# Patient Record
Sex: Female | Born: 1945 | Race: White | Hispanic: No | Marital: Married | Smoking: Never smoker
Health system: Southern US, Community
[De-identification: ages and names within clinical notes are randomized; demographics above are authoritative.]

## PROBLEM LIST (undated history)

## (undated) DIAGNOSIS — J189 Pneumonia, unspecified organism: Secondary | ICD-10-CM

## (undated) DIAGNOSIS — I10 Essential (primary) hypertension: Secondary | ICD-10-CM

## (undated) DIAGNOSIS — Z923 Personal history of irradiation: Secondary | ICD-10-CM

## (undated) DIAGNOSIS — C801 Malignant (primary) neoplasm, unspecified: Secondary | ICD-10-CM

## (undated) DIAGNOSIS — G629 Polyneuropathy, unspecified: Secondary | ICD-10-CM

## (undated) DIAGNOSIS — M359 Systemic involvement of connective tissue, unspecified: Secondary | ICD-10-CM

## (undated) DIAGNOSIS — R011 Cardiac murmur, unspecified: Secondary | ICD-10-CM

## (undated) DIAGNOSIS — R0600 Dyspnea, unspecified: Secondary | ICD-10-CM

## (undated) DIAGNOSIS — R519 Headache, unspecified: Secondary | ICD-10-CM

## (undated) HISTORY — PX: CHOLECYSTECTOMY: SHX55

## (undated) HISTORY — PX: CATARACT EXTRACTION: SUR2

## (undated) HISTORY — PX: LUMBAR FUSION: SHX111

## (undated) HISTORY — PX: TONSILLECTOMY: SUR1361

## (undated) HISTORY — PX: CARPAL TUNNEL RELEASE: SHX101

## (undated) HISTORY — PX: COLONOSCOPY: SHX174

## (undated) HISTORY — PX: CATARACT EXTRACTION W/ INTRAOCULAR LENS IMPLANT: SHX1309

## (undated) HISTORY — DX: Polyneuropathy, unspecified: G62.9

## (undated) HISTORY — DX: Essential (primary) hypertension: I10

## (undated) HISTORY — PX: BREAST LUMPECTOMY: SHX2

---
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
## 1997-09-10 HISTORY — PX: BREAST LUMPECTOMY: SHX2

## 1997-12-03 ENCOUNTER — Ambulatory Visit (HOSPITAL_COMMUNITY): Admission: RE | Admit: 1997-12-03 | Discharge: 1997-12-03 | Payer: Self-pay | Admitting: Obstetrics and Gynecology

## 1997-12-08 ENCOUNTER — Ambulatory Visit (HOSPITAL_COMMUNITY): Admission: RE | Admit: 1997-12-08 | Discharge: 1997-12-08 | Payer: Self-pay | Admitting: Obstetrics and Gynecology

## 1998-01-05 ENCOUNTER — Ambulatory Visit (HOSPITAL_COMMUNITY): Admission: RE | Admit: 1998-01-05 | Discharge: 1998-01-05 | Payer: Self-pay | Admitting: *Deleted

## 1998-01-26 ENCOUNTER — Ambulatory Visit (HOSPITAL_COMMUNITY): Admission: RE | Admit: 1998-01-26 | Discharge: 1998-01-26 | Payer: Self-pay | Admitting: *Deleted

## 1998-02-10 ENCOUNTER — Encounter: Admission: RE | Admit: 1998-02-10 | Discharge: 1998-05-11 | Payer: Self-pay | Admitting: Radiation Oncology

## 1998-03-01 ENCOUNTER — Ambulatory Visit (HOSPITAL_COMMUNITY): Admission: RE | Admit: 1998-03-01 | Discharge: 1998-03-01 | Payer: Self-pay | Admitting: Radiation Oncology

## 1998-06-17 ENCOUNTER — Other Ambulatory Visit: Admission: RE | Admit: 1998-06-17 | Discharge: 1998-06-17 | Payer: Self-pay | Admitting: Obstetrics and Gynecology

## 1998-08-15 ENCOUNTER — Ambulatory Visit (HOSPITAL_COMMUNITY): Admission: RE | Admit: 1998-08-15 | Discharge: 1998-08-15 | Payer: Self-pay | Admitting: Obstetrics and Gynecology

## 1998-08-15 ENCOUNTER — Encounter: Payer: Self-pay | Admitting: Obstetrics and Gynecology

## 1999-02-21 ENCOUNTER — Ambulatory Visit (HOSPITAL_COMMUNITY): Admission: RE | Admit: 1999-02-21 | Discharge: 1999-02-21 | Payer: Self-pay | Admitting: Obstetrics and Gynecology

## 1999-02-21 ENCOUNTER — Encounter: Payer: Self-pay | Admitting: Obstetrics and Gynecology

## 1999-06-20 ENCOUNTER — Other Ambulatory Visit: Admission: RE | Admit: 1999-06-20 | Discharge: 1999-06-20 | Payer: Self-pay | Admitting: Obstetrics and Gynecology

## 1999-08-22 ENCOUNTER — Encounter: Payer: Self-pay | Admitting: General Surgery

## 1999-08-22 ENCOUNTER — Ambulatory Visit (HOSPITAL_COMMUNITY): Admission: RE | Admit: 1999-08-22 | Discharge: 1999-08-22 | Payer: Self-pay | Admitting: General Surgery

## 2000-02-16 ENCOUNTER — Encounter: Payer: Self-pay | Admitting: Obstetrics and Gynecology

## 2000-02-16 ENCOUNTER — Encounter: Admission: RE | Admit: 2000-02-16 | Discharge: 2000-02-16 | Payer: Self-pay | Admitting: Obstetrics and Gynecology

## 2000-06-28 ENCOUNTER — Other Ambulatory Visit: Admission: RE | Admit: 2000-06-28 | Discharge: 2000-06-28 | Payer: Self-pay | Admitting: Obstetrics and Gynecology

## 2000-08-23 ENCOUNTER — Encounter: Admission: RE | Admit: 2000-08-23 | Discharge: 2000-08-23 | Payer: Self-pay | Admitting: Obstetrics and Gynecology

## 2000-08-23 ENCOUNTER — Encounter: Payer: Self-pay | Admitting: Obstetrics and Gynecology

## 2001-07-11 ENCOUNTER — Other Ambulatory Visit: Admission: RE | Admit: 2001-07-11 | Discharge: 2001-07-11 | Payer: Self-pay | Admitting: Obstetrics and Gynecology

## 2001-09-09 ENCOUNTER — Encounter: Payer: Self-pay | Admitting: Obstetrics and Gynecology

## 2001-09-09 ENCOUNTER — Encounter: Admission: RE | Admit: 2001-09-09 | Discharge: 2001-09-09 | Payer: Self-pay | Admitting: Obstetrics and Gynecology

## 2002-08-21 ENCOUNTER — Other Ambulatory Visit: Admission: RE | Admit: 2002-08-21 | Discharge: 2002-08-21 | Payer: Self-pay | Admitting: Obstetrics and Gynecology

## 2002-09-25 ENCOUNTER — Encounter: Payer: Self-pay | Admitting: Obstetrics and Gynecology

## 2002-09-25 ENCOUNTER — Encounter: Admission: RE | Admit: 2002-09-25 | Discharge: 2002-09-25 | Payer: Self-pay | Admitting: Obstetrics and Gynecology

## 2003-07-12 ENCOUNTER — Ambulatory Visit (HOSPITAL_COMMUNITY): Admission: RE | Admit: 2003-07-12 | Discharge: 2003-07-12 | Payer: Self-pay | Admitting: *Deleted

## 2003-09-17 ENCOUNTER — Other Ambulatory Visit: Admission: RE | Admit: 2003-09-17 | Discharge: 2003-09-17 | Payer: Self-pay | Admitting: Obstetrics and Gynecology

## 2003-12-31 ENCOUNTER — Encounter: Admission: RE | Admit: 2003-12-31 | Discharge: 2003-12-31 | Payer: Self-pay | Admitting: General Surgery

## 2004-09-28 ENCOUNTER — Encounter: Admission: RE | Admit: 2004-09-28 | Discharge: 2004-09-28 | Payer: Self-pay | Admitting: Neurosurgery

## 2004-09-29 ENCOUNTER — Other Ambulatory Visit: Admission: RE | Admit: 2004-09-29 | Discharge: 2004-09-29 | Payer: Self-pay | Admitting: Obstetrics and Gynecology

## 2005-01-17 ENCOUNTER — Encounter: Admission: RE | Admit: 2005-01-17 | Discharge: 2005-01-17 | Payer: Self-pay | Admitting: General Surgery

## 2005-09-10 HISTORY — PX: CARPAL TUNNEL RELEASE: SHX101

## 2005-11-07 ENCOUNTER — Other Ambulatory Visit: Admission: RE | Admit: 2005-11-07 | Discharge: 2005-11-07 | Payer: Self-pay | Admitting: Obstetrics and Gynecology

## 2006-03-29 ENCOUNTER — Encounter: Admission: RE | Admit: 2006-03-29 | Discharge: 2006-03-29 | Payer: Self-pay | Admitting: General Surgery

## 2006-04-12 ENCOUNTER — Encounter: Admission: RE | Admit: 2006-04-12 | Discharge: 2006-04-12 | Payer: Self-pay | Admitting: General Surgery

## 2006-09-10 HISTORY — PX: CHOLECYSTECTOMY: SHX55

## 2007-03-07 ENCOUNTER — Encounter: Admission: RE | Admit: 2007-03-07 | Discharge: 2007-03-07 | Payer: Self-pay | Admitting: General Surgery

## 2008-03-16 ENCOUNTER — Encounter: Admission: RE | Admit: 2008-03-16 | Discharge: 2008-03-16 | Payer: Self-pay | Admitting: Obstetrics and Gynecology

## 2009-04-29 ENCOUNTER — Encounter: Admission: RE | Admit: 2009-04-29 | Discharge: 2009-04-29 | Payer: Self-pay | Admitting: Obstetrics and Gynecology

## 2010-08-11 ENCOUNTER — Encounter: Admission: RE | Admit: 2010-08-11 | Discharge: 2010-08-11 | Payer: Self-pay | Admitting: Obstetrics and Gynecology

## 2010-09-30 ENCOUNTER — Encounter: Payer: Self-pay | Admitting: Radiation Oncology

## 2010-10-01 ENCOUNTER — Encounter: Payer: Self-pay | Admitting: General Surgery

## 2010-10-02 ENCOUNTER — Encounter: Payer: Self-pay | Admitting: Obstetrics and Gynecology

## 2011-07-10 ENCOUNTER — Other Ambulatory Visit: Payer: Self-pay | Admitting: Obstetrics and Gynecology

## 2011-07-10 DIAGNOSIS — Z1231 Encounter for screening mammogram for malignant neoplasm of breast: Secondary | ICD-10-CM

## 2011-08-17 ENCOUNTER — Ambulatory Visit
Admission: RE | Admit: 2011-08-17 | Discharge: 2011-08-17 | Disposition: A | Discharge status: Home / Self Care | Payer: 59 | Source: Ambulatory Visit | Attending: Obstetrics and Gynecology | Admitting: Obstetrics and Gynecology

## 2011-08-17 DIAGNOSIS — Z1231 Encounter for screening mammogram for malignant neoplasm of breast: Secondary | ICD-10-CM

## 2012-07-11 ENCOUNTER — Other Ambulatory Visit: Payer: Self-pay | Admitting: Obstetrics and Gynecology

## 2012-07-11 DIAGNOSIS — Z1231 Encounter for screening mammogram for malignant neoplasm of breast: Secondary | ICD-10-CM

## 2012-08-22 ENCOUNTER — Ambulatory Visit
Admission: RE | Admit: 2012-08-22 | Discharge: 2012-08-22 | Disposition: A | Discharge status: Home / Self Care | Payer: BC Managed Care – PPO | Source: Ambulatory Visit | Attending: Obstetrics and Gynecology | Admitting: Obstetrics and Gynecology

## 2012-08-22 DIAGNOSIS — Z1231 Encounter for screening mammogram for malignant neoplasm of breast: Secondary | ICD-10-CM

## 2013-07-20 ENCOUNTER — Other Ambulatory Visit: Payer: Self-pay

## 2013-07-20 DIAGNOSIS — Z1231 Encounter for screening mammogram for malignant neoplasm of breast: Secondary | ICD-10-CM

## 2013-08-28 ENCOUNTER — Ambulatory Visit
Admission: RE | Admit: 2013-08-28 | Discharge: 2013-08-28 | Disposition: A | Discharge status: Home / Self Care | Payer: BC Managed Care – PPO | Source: Ambulatory Visit

## 2013-08-28 ENCOUNTER — Ambulatory Visit: Payer: BC Managed Care – PPO

## 2013-08-28 DIAGNOSIS — Z1231 Encounter for screening mammogram for malignant neoplasm of breast: Secondary | ICD-10-CM

## 2013-11-30 ENCOUNTER — Other Ambulatory Visit: Payer: Self-pay | Admitting: Chiropractic Medicine

## 2013-11-30 DIAGNOSIS — M5126 Other intervertebral disc displacement, lumbar region: Secondary | ICD-10-CM

## 2013-12-04 ENCOUNTER — Ambulatory Visit
Admission: RE | Admit: 2013-12-04 | Discharge: 2013-12-04 | Disposition: A | Discharge status: Home / Self Care | Payer: BC Managed Care – PPO | Source: Ambulatory Visit | Attending: Chiropractic Medicine | Admitting: Chiropractic Medicine

## 2013-12-04 DIAGNOSIS — M5126 Other intervertebral disc displacement, lumbar region: Secondary | ICD-10-CM

## 2014-03-26 DIAGNOSIS — M47817 Spondylosis without myelopathy or radiculopathy, lumbosacral region: Secondary | ICD-10-CM | POA: Insufficient documentation

## 2014-09-06 ENCOUNTER — Other Ambulatory Visit: Payer: Self-pay

## 2014-09-06 DIAGNOSIS — Z1231 Encounter for screening mammogram for malignant neoplasm of breast: Secondary | ICD-10-CM

## 2014-09-09 ENCOUNTER — Ambulatory Visit
Admission: RE | Admit: 2014-09-09 | Discharge: 2014-09-09 | Disposition: A | Discharge status: Home / Self Care | Payer: Medicare HMO | Source: Ambulatory Visit

## 2014-09-09 DIAGNOSIS — Z1231 Encounter for screening mammogram for malignant neoplasm of breast: Secondary | ICD-10-CM

## 2015-09-13 DIAGNOSIS — M9902 Segmental and somatic dysfunction of thoracic region: Secondary | ICD-10-CM | POA: Diagnosis not present

## 2015-09-13 DIAGNOSIS — M9903 Segmental and somatic dysfunction of lumbar region: Secondary | ICD-10-CM | POA: Diagnosis not present

## 2015-09-13 DIAGNOSIS — M542 Cervicalgia: Secondary | ICD-10-CM | POA: Diagnosis not present

## 2015-09-13 DIAGNOSIS — M545 Low back pain: Secondary | ICD-10-CM | POA: Diagnosis not present

## 2015-09-13 DIAGNOSIS — M546 Pain in thoracic spine: Secondary | ICD-10-CM | POA: Diagnosis not present

## 2015-09-13 DIAGNOSIS — M9901 Segmental and somatic dysfunction of cervical region: Secondary | ICD-10-CM | POA: Diagnosis not present

## 2015-09-13 DIAGNOSIS — M47812 Spondylosis without myelopathy or radiculopathy, cervical region: Secondary | ICD-10-CM | POA: Diagnosis not present

## 2015-09-15 DIAGNOSIS — M542 Cervicalgia: Secondary | ICD-10-CM | POA: Diagnosis not present

## 2015-09-15 DIAGNOSIS — M546 Pain in thoracic spine: Secondary | ICD-10-CM | POA: Diagnosis not present

## 2015-09-15 DIAGNOSIS — M545 Low back pain: Secondary | ICD-10-CM | POA: Diagnosis not present

## 2015-09-15 DIAGNOSIS — M47812 Spondylosis without myelopathy or radiculopathy, cervical region: Secondary | ICD-10-CM | POA: Diagnosis not present

## 2015-09-15 DIAGNOSIS — M9902 Segmental and somatic dysfunction of thoracic region: Secondary | ICD-10-CM | POA: Diagnosis not present

## 2015-09-15 DIAGNOSIS — M9903 Segmental and somatic dysfunction of lumbar region: Secondary | ICD-10-CM | POA: Diagnosis not present

## 2015-09-15 DIAGNOSIS — M9901 Segmental and somatic dysfunction of cervical region: Secondary | ICD-10-CM | POA: Diagnosis not present

## 2015-09-21 DIAGNOSIS — M9901 Segmental and somatic dysfunction of cervical region: Secondary | ICD-10-CM | POA: Diagnosis not present

## 2015-09-21 DIAGNOSIS — M47812 Spondylosis without myelopathy or radiculopathy, cervical region: Secondary | ICD-10-CM | POA: Diagnosis not present

## 2015-09-21 DIAGNOSIS — M546 Pain in thoracic spine: Secondary | ICD-10-CM | POA: Diagnosis not present

## 2015-09-21 DIAGNOSIS — M542 Cervicalgia: Secondary | ICD-10-CM | POA: Diagnosis not present

## 2015-09-21 DIAGNOSIS — M9903 Segmental and somatic dysfunction of lumbar region: Secondary | ICD-10-CM | POA: Diagnosis not present

## 2015-09-21 DIAGNOSIS — M9902 Segmental and somatic dysfunction of thoracic region: Secondary | ICD-10-CM | POA: Diagnosis not present

## 2015-09-21 DIAGNOSIS — M545 Low back pain: Secondary | ICD-10-CM | POA: Diagnosis not present

## 2015-09-29 ENCOUNTER — Other Ambulatory Visit: Payer: Self-pay

## 2015-09-29 DIAGNOSIS — Z1231 Encounter for screening mammogram for malignant neoplasm of breast: Secondary | ICD-10-CM

## 2015-10-12 ENCOUNTER — Ambulatory Visit
Admission: RE | Admit: 2015-10-12 | Discharge: 2015-10-12 | Disposition: A | Discharge status: Home / Self Care | Payer: PPO | Source: Ambulatory Visit

## 2015-10-12 DIAGNOSIS — Z1231 Encounter for screening mammogram for malignant neoplasm of breast: Secondary | ICD-10-CM | POA: Diagnosis not present

## 2015-10-13 DIAGNOSIS — M9903 Segmental and somatic dysfunction of lumbar region: Secondary | ICD-10-CM | POA: Diagnosis not present

## 2015-10-13 DIAGNOSIS — M62830 Muscle spasm of back: Secondary | ICD-10-CM | POA: Diagnosis not present

## 2015-10-13 DIAGNOSIS — M9901 Segmental and somatic dysfunction of cervical region: Secondary | ICD-10-CM | POA: Diagnosis not present

## 2015-10-17 DIAGNOSIS — M62830 Muscle spasm of back: Secondary | ICD-10-CM | POA: Diagnosis not present

## 2015-10-17 DIAGNOSIS — M9903 Segmental and somatic dysfunction of lumbar region: Secondary | ICD-10-CM | POA: Diagnosis not present

## 2015-10-17 DIAGNOSIS — M9901 Segmental and somatic dysfunction of cervical region: Secondary | ICD-10-CM | POA: Diagnosis not present

## 2015-10-17 DIAGNOSIS — M9902 Segmental and somatic dysfunction of thoracic region: Secondary | ICD-10-CM | POA: Diagnosis not present

## 2015-10-18 DIAGNOSIS — M542 Cervicalgia: Secondary | ICD-10-CM | POA: Insufficient documentation

## 2015-10-18 DIAGNOSIS — M5030 Other cervical disc degeneration, unspecified cervical region: Secondary | ICD-10-CM | POA: Diagnosis not present

## 2015-10-18 DIAGNOSIS — M47816 Spondylosis without myelopathy or radiculopathy, lumbar region: Secondary | ICD-10-CM | POA: Diagnosis not present

## 2015-10-18 DIAGNOSIS — M47812 Spondylosis without myelopathy or radiculopathy, cervical region: Secondary | ICD-10-CM | POA: Diagnosis not present

## 2015-10-24 DIAGNOSIS — M1812 Unilateral primary osteoarthritis of first carpometacarpal joint, left hand: Secondary | ICD-10-CM | POA: Diagnosis not present

## 2015-11-07 DIAGNOSIS — M9901 Segmental and somatic dysfunction of cervical region: Secondary | ICD-10-CM | POA: Diagnosis not present

## 2015-11-07 DIAGNOSIS — M9906 Segmental and somatic dysfunction of lower extremity: Secondary | ICD-10-CM | POA: Diagnosis not present

## 2015-11-07 DIAGNOSIS — M5032 Other cervical disc degeneration, mid-cervical region, unspecified level: Secondary | ICD-10-CM | POA: Diagnosis not present

## 2015-11-07 DIAGNOSIS — M9902 Segmental and somatic dysfunction of thoracic region: Secondary | ICD-10-CM | POA: Diagnosis not present

## 2015-11-21 DIAGNOSIS — M9906 Segmental and somatic dysfunction of lower extremity: Secondary | ICD-10-CM | POA: Diagnosis not present

## 2015-11-21 DIAGNOSIS — M5032 Other cervical disc degeneration, mid-cervical region, unspecified level: Secondary | ICD-10-CM | POA: Diagnosis not present

## 2015-11-21 DIAGNOSIS — M9902 Segmental and somatic dysfunction of thoracic region: Secondary | ICD-10-CM | POA: Diagnosis not present

## 2015-11-21 DIAGNOSIS — M9901 Segmental and somatic dysfunction of cervical region: Secondary | ICD-10-CM | POA: Diagnosis not present

## 2015-12-12 DIAGNOSIS — M5032 Other cervical disc degeneration, mid-cervical region, unspecified level: Secondary | ICD-10-CM | POA: Diagnosis not present

## 2015-12-12 DIAGNOSIS — M9901 Segmental and somatic dysfunction of cervical region: Secondary | ICD-10-CM | POA: Diagnosis not present

## 2015-12-12 DIAGNOSIS — H35363 Drusen (degenerative) of macula, bilateral: Secondary | ICD-10-CM | POA: Diagnosis not present

## 2015-12-12 DIAGNOSIS — M9906 Segmental and somatic dysfunction of lower extremity: Secondary | ICD-10-CM | POA: Diagnosis not present

## 2015-12-12 DIAGNOSIS — H401131 Primary open-angle glaucoma, bilateral, mild stage: Secondary | ICD-10-CM | POA: Diagnosis not present

## 2015-12-12 DIAGNOSIS — M9902 Segmental and somatic dysfunction of thoracic region: Secondary | ICD-10-CM | POA: Diagnosis not present

## 2016-01-20 DIAGNOSIS — I341 Nonrheumatic mitral (valve) prolapse: Secondary | ICD-10-CM

## 2016-01-20 DIAGNOSIS — G5760 Lesion of plantar nerve, unspecified lower limb: Secondary | ICD-10-CM | POA: Insufficient documentation

## 2016-01-20 DIAGNOSIS — M8580 Other specified disorders of bone density and structure, unspecified site: Secondary | ICD-10-CM

## 2016-01-20 DIAGNOSIS — R5383 Other fatigue: Secondary | ICD-10-CM

## 2016-01-20 DIAGNOSIS — R5381 Other malaise: Secondary | ICD-10-CM | POA: Insufficient documentation

## 2016-01-20 DIAGNOSIS — E782 Mixed hyperlipidemia: Secondary | ICD-10-CM

## 2016-01-20 HISTORY — DX: Other specified disorders of bone density and structure, unspecified site: M85.80

## 2016-01-20 HISTORY — DX: Mixed hyperlipidemia: E78.2

## 2016-01-20 HISTORY — DX: Other fatigue: R53.83

## 2016-01-20 HISTORY — DX: Nonrheumatic mitral (valve) prolapse: I34.1

## 2016-01-20 HISTORY — DX: Other malaise: R53.81

## 2016-01-23 DIAGNOSIS — I341 Nonrheumatic mitral (valve) prolapse: Secondary | ICD-10-CM | POA: Diagnosis not present

## 2016-01-23 DIAGNOSIS — R5381 Other malaise: Secondary | ICD-10-CM | POA: Diagnosis not present

## 2016-01-23 DIAGNOSIS — M8949 Other hypertrophic osteoarthropathy, multiple sites: Secondary | ICD-10-CM

## 2016-01-23 DIAGNOSIS — H402234 Chronic angle-closure glaucoma, bilateral, indeterminate stage: Secondary | ICD-10-CM | POA: Diagnosis not present

## 2016-01-23 DIAGNOSIS — E782 Mixed hyperlipidemia: Secondary | ICD-10-CM | POA: Diagnosis not present

## 2016-01-23 DIAGNOSIS — M8580 Other specified disorders of bone density and structure, unspecified site: Secondary | ICD-10-CM | POA: Diagnosis not present

## 2016-01-23 DIAGNOSIS — M159 Polyosteoarthritis, unspecified: Secondary | ICD-10-CM

## 2016-01-23 DIAGNOSIS — M150 Primary generalized (osteo)arthritis: Secondary | ICD-10-CM

## 2016-01-23 DIAGNOSIS — R5383 Other fatigue: Secondary | ICD-10-CM | POA: Diagnosis not present

## 2016-01-23 HISTORY — DX: Polyosteoarthritis, unspecified: M15.9

## 2016-01-23 HISTORY — DX: Other hypertrophic osteoarthropathy, multiple sites: M89.49

## 2016-01-23 HISTORY — DX: Primary generalized (osteo)arthritis: M15.0

## 2016-01-30 DIAGNOSIS — M25561 Pain in right knee: Secondary | ICD-10-CM | POA: Diagnosis not present

## 2016-02-09 DIAGNOSIS — M25561 Pain in right knee: Secondary | ICD-10-CM | POA: Diagnosis not present

## 2016-05-08 DIAGNOSIS — I341 Nonrheumatic mitral (valve) prolapse: Secondary | ICD-10-CM | POA: Diagnosis not present

## 2016-05-08 DIAGNOSIS — E785 Hyperlipidemia, unspecified: Secondary | ICD-10-CM | POA: Insufficient documentation

## 2016-05-08 DIAGNOSIS — R0789 Other chest pain: Secondary | ICD-10-CM | POA: Diagnosis not present

## 2016-05-08 DIAGNOSIS — R002 Palpitations: Secondary | ICD-10-CM | POA: Diagnosis not present

## 2016-05-08 HISTORY — DX: Hyperlipidemia, unspecified: E78.5

## 2016-05-29 DIAGNOSIS — R002 Palpitations: Secondary | ICD-10-CM | POA: Diagnosis not present

## 2016-05-29 DIAGNOSIS — I341 Nonrheumatic mitral (valve) prolapse: Secondary | ICD-10-CM | POA: Diagnosis not present

## 2016-05-29 DIAGNOSIS — E785 Hyperlipidemia, unspecified: Secondary | ICD-10-CM | POA: Diagnosis not present

## 2016-05-29 DIAGNOSIS — R0789 Other chest pain: Secondary | ICD-10-CM | POA: Diagnosis not present

## 2016-06-06 DIAGNOSIS — Z6825 Body mass index (BMI) 25.0-25.9, adult: Secondary | ICD-10-CM | POA: Diagnosis not present

## 2016-06-06 DIAGNOSIS — Z01419 Encounter for gynecological examination (general) (routine) without abnormal findings: Secondary | ICD-10-CM | POA: Diagnosis not present

## 2016-06-12 DIAGNOSIS — R002 Palpitations: Secondary | ICD-10-CM | POA: Diagnosis not present

## 2016-06-12 DIAGNOSIS — I341 Nonrheumatic mitral (valve) prolapse: Secondary | ICD-10-CM | POA: Diagnosis not present

## 2016-06-12 DIAGNOSIS — E785 Hyperlipidemia, unspecified: Secondary | ICD-10-CM | POA: Diagnosis not present

## 2016-06-12 DIAGNOSIS — R0789 Other chest pain: Secondary | ICD-10-CM | POA: Diagnosis not present

## 2016-06-25 DIAGNOSIS — M25561 Pain in right knee: Secondary | ICD-10-CM | POA: Diagnosis not present

## 2016-06-26 DIAGNOSIS — H524 Presbyopia: Secondary | ICD-10-CM | POA: Diagnosis not present

## 2016-06-26 DIAGNOSIS — H401131 Primary open-angle glaucoma, bilateral, mild stage: Secondary | ICD-10-CM | POA: Diagnosis not present

## 2016-06-26 DIAGNOSIS — H35363 Drusen (degenerative) of macula, bilateral: Secondary | ICD-10-CM | POA: Diagnosis not present

## 2016-06-26 DIAGNOSIS — H2513 Age-related nuclear cataract, bilateral: Secondary | ICD-10-CM | POA: Diagnosis not present

## 2016-07-26 DIAGNOSIS — M1812 Unilateral primary osteoarthritis of first carpometacarpal joint, left hand: Secondary | ICD-10-CM | POA: Diagnosis not present

## 2016-07-26 DIAGNOSIS — M79642 Pain in left hand: Secondary | ICD-10-CM | POA: Diagnosis not present

## 2016-09-14 DIAGNOSIS — J069 Acute upper respiratory infection, unspecified: Secondary | ICD-10-CM | POA: Diagnosis not present

## 2016-09-25 DIAGNOSIS — R5381 Other malaise: Secondary | ICD-10-CM | POA: Diagnosis not present

## 2016-09-25 DIAGNOSIS — R5383 Other fatigue: Secondary | ICD-10-CM | POA: Diagnosis not present

## 2016-09-25 DIAGNOSIS — I517 Cardiomegaly: Secondary | ICD-10-CM | POA: Diagnosis not present

## 2016-09-25 DIAGNOSIS — J440 Chronic obstructive pulmonary disease with acute lower respiratory infection: Secondary | ICD-10-CM | POA: Diagnosis not present

## 2016-09-25 DIAGNOSIS — R05 Cough: Secondary | ICD-10-CM | POA: Diagnosis not present

## 2016-09-25 DIAGNOSIS — J40 Bronchitis, not specified as acute or chronic: Secondary | ICD-10-CM | POA: Diagnosis not present

## 2016-10-22 ENCOUNTER — Other Ambulatory Visit: Payer: Self-pay | Admitting: Obstetrics and Gynecology

## 2016-10-22 DIAGNOSIS — Z1231 Encounter for screening mammogram for malignant neoplasm of breast: Secondary | ICD-10-CM

## 2016-10-23 DIAGNOSIS — Z1211 Encounter for screening for malignant neoplasm of colon: Secondary | ICD-10-CM | POA: Diagnosis not present

## 2016-10-31 DIAGNOSIS — K5730 Diverticulosis of large intestine without perforation or abscess without bleeding: Secondary | ICD-10-CM | POA: Diagnosis not present

## 2016-10-31 DIAGNOSIS — D120 Benign neoplasm of cecum: Secondary | ICD-10-CM | POA: Diagnosis not present

## 2016-10-31 DIAGNOSIS — Z1211 Encounter for screening for malignant neoplasm of colon: Secondary | ICD-10-CM | POA: Diagnosis not present

## 2016-10-31 DIAGNOSIS — K635 Polyp of colon: Secondary | ICD-10-CM | POA: Diagnosis not present

## 2016-11-06 ENCOUNTER — Ambulatory Visit
Admission: RE | Admit: 2016-11-06 | Discharge: 2016-11-06 | Disposition: A | Discharge status: Home / Self Care | Payer: PPO | Source: Ambulatory Visit | Attending: Obstetrics and Gynecology | Admitting: Obstetrics and Gynecology

## 2016-11-06 DIAGNOSIS — Z1231 Encounter for screening mammogram for malignant neoplasm of breast: Secondary | ICD-10-CM

## 2016-12-24 DIAGNOSIS — H401131 Primary open-angle glaucoma, bilateral, mild stage: Secondary | ICD-10-CM | POA: Diagnosis not present

## 2017-04-17 DIAGNOSIS — N958 Other specified menopausal and perimenopausal disorders: Secondary | ICD-10-CM | POA: Diagnosis not present

## 2017-04-17 DIAGNOSIS — M8588 Other specified disorders of bone density and structure, other site: Secondary | ICD-10-CM | POA: Diagnosis not present

## 2017-06-18 DIAGNOSIS — Z124 Encounter for screening for malignant neoplasm of cervix: Secondary | ICD-10-CM | POA: Diagnosis not present

## 2017-06-18 DIAGNOSIS — Z6826 Body mass index (BMI) 26.0-26.9, adult: Secondary | ICD-10-CM | POA: Diagnosis not present

## 2017-07-02 DIAGNOSIS — H401131 Primary open-angle glaucoma, bilateral, mild stage: Secondary | ICD-10-CM | POA: Diagnosis not present

## 2017-07-02 DIAGNOSIS — H2513 Age-related nuclear cataract, bilateral: Secondary | ICD-10-CM | POA: Diagnosis not present

## 2017-07-02 DIAGNOSIS — H52223 Regular astigmatism, bilateral: Secondary | ICD-10-CM | POA: Diagnosis not present

## 2017-07-02 DIAGNOSIS — H35363 Drusen (degenerative) of macula, bilateral: Secondary | ICD-10-CM | POA: Diagnosis not present

## 2017-10-17 ENCOUNTER — Other Ambulatory Visit: Payer: Self-pay | Admitting: Obstetrics and Gynecology

## 2017-10-17 DIAGNOSIS — Z1231 Encounter for screening mammogram for malignant neoplasm of breast: Secondary | ICD-10-CM

## 2017-11-12 ENCOUNTER — Ambulatory Visit: Payer: Self-pay

## 2017-11-13 ENCOUNTER — Ambulatory Visit
Admission: RE | Admit: 2017-11-13 | Discharge: 2017-11-13 | Disposition: A | Discharge status: Home / Self Care | Payer: PPO | Source: Ambulatory Visit | Attending: Obstetrics and Gynecology | Admitting: Obstetrics and Gynecology

## 2017-11-13 DIAGNOSIS — Z1231 Encounter for screening mammogram for malignant neoplasm of breast: Secondary | ICD-10-CM

## 2017-11-13 HISTORY — DX: Systemic involvement of connective tissue, unspecified: M35.9

## 2017-11-13 HISTORY — DX: Personal history of irradiation: Z92.3

## 2017-11-13 HISTORY — DX: Malignant (primary) neoplasm, unspecified: C80.1

## 2017-12-31 DIAGNOSIS — H401131 Primary open-angle glaucoma, bilateral, mild stage: Secondary | ICD-10-CM | POA: Diagnosis not present

## 2018-05-06 DIAGNOSIS — M5136 Other intervertebral disc degeneration, lumbar region: Secondary | ICD-10-CM | POA: Diagnosis not present

## 2018-05-06 DIAGNOSIS — M48062 Spinal stenosis, lumbar region with neurogenic claudication: Secondary | ICD-10-CM | POA: Diagnosis not present

## 2018-05-06 DIAGNOSIS — M47816 Spondylosis without myelopathy or radiculopathy, lumbar region: Secondary | ICD-10-CM | POA: Diagnosis not present

## 2018-05-06 DIAGNOSIS — M4316 Spondylolisthesis, lumbar region: Secondary | ICD-10-CM | POA: Diagnosis not present

## 2018-06-24 DIAGNOSIS — Z01419 Encounter for gynecological examination (general) (routine) without abnormal findings: Secondary | ICD-10-CM | POA: Diagnosis not present

## 2018-06-24 DIAGNOSIS — Z6827 Body mass index (BMI) 27.0-27.9, adult: Secondary | ICD-10-CM | POA: Diagnosis not present

## 2018-07-02 DIAGNOSIS — H401131 Primary open-angle glaucoma, bilateral, mild stage: Secondary | ICD-10-CM | POA: Diagnosis not present

## 2018-07-02 DIAGNOSIS — H2513 Age-related nuclear cataract, bilateral: Secondary | ICD-10-CM | POA: Diagnosis not present

## 2018-07-02 DIAGNOSIS — H35363 Drusen (degenerative) of macula, bilateral: Secondary | ICD-10-CM | POA: Diagnosis not present

## 2018-07-02 DIAGNOSIS — H52223 Regular astigmatism, bilateral: Secondary | ICD-10-CM | POA: Diagnosis not present

## 2018-08-14 DIAGNOSIS — L820 Inflamed seborrheic keratosis: Secondary | ICD-10-CM | POA: Diagnosis not present

## 2018-08-14 DIAGNOSIS — L821 Other seborrheic keratosis: Secondary | ICD-10-CM | POA: Diagnosis not present

## 2018-08-14 DIAGNOSIS — D1801 Hemangioma of skin and subcutaneous tissue: Secondary | ICD-10-CM | POA: Diagnosis not present

## 2018-09-18 DIAGNOSIS — H25013 Cortical age-related cataract, bilateral: Secondary | ICD-10-CM | POA: Diagnosis not present

## 2018-09-18 DIAGNOSIS — H25043 Posterior subcapsular polar age-related cataract, bilateral: Secondary | ICD-10-CM | POA: Diagnosis not present

## 2018-09-18 DIAGNOSIS — H2513 Age-related nuclear cataract, bilateral: Secondary | ICD-10-CM | POA: Diagnosis not present

## 2018-09-18 DIAGNOSIS — H401131 Primary open-angle glaucoma, bilateral, mild stage: Secondary | ICD-10-CM | POA: Diagnosis not present

## 2018-09-18 DIAGNOSIS — H2512 Age-related nuclear cataract, left eye: Secondary | ICD-10-CM | POA: Diagnosis not present

## 2018-09-18 DIAGNOSIS — H18413 Arcus senilis, bilateral: Secondary | ICD-10-CM | POA: Diagnosis not present

## 2018-10-06 DIAGNOSIS — R5381 Other malaise: Secondary | ICD-10-CM | POA: Diagnosis not present

## 2018-10-06 DIAGNOSIS — H402234 Chronic angle-closure glaucoma, bilateral, indeterminate stage: Secondary | ICD-10-CM | POA: Diagnosis not present

## 2018-10-06 DIAGNOSIS — R9389 Abnormal findings on diagnostic imaging of other specified body structures: Secondary | ICD-10-CM

## 2018-10-06 DIAGNOSIS — M150 Primary generalized (osteo)arthritis: Secondary | ICD-10-CM | POA: Diagnosis not present

## 2018-10-06 DIAGNOSIS — R030 Elevated blood-pressure reading, without diagnosis of hypertension: Secondary | ICD-10-CM

## 2018-10-06 DIAGNOSIS — I341 Nonrheumatic mitral (valve) prolapse: Secondary | ICD-10-CM | POA: Diagnosis not present

## 2018-10-06 DIAGNOSIS — E559 Vitamin D deficiency, unspecified: Secondary | ICD-10-CM | POA: Diagnosis not present

## 2018-10-06 DIAGNOSIS — E782 Mixed hyperlipidemia: Secondary | ICD-10-CM | POA: Diagnosis not present

## 2018-10-06 DIAGNOSIS — R5383 Other fatigue: Secondary | ICD-10-CM | POA: Diagnosis not present

## 2018-10-06 HISTORY — DX: Elevated blood-pressure reading, without diagnosis of hypertension: R03.0

## 2018-10-06 HISTORY — DX: Abnormal findings on diagnostic imaging of other specified body structures: R93.89

## 2018-10-22 DIAGNOSIS — H2512 Age-related nuclear cataract, left eye: Secondary | ICD-10-CM | POA: Diagnosis not present

## 2018-10-23 DIAGNOSIS — H2511 Age-related nuclear cataract, right eye: Secondary | ICD-10-CM | POA: Diagnosis not present

## 2018-10-29 DIAGNOSIS — R030 Elevated blood-pressure reading, without diagnosis of hypertension: Secondary | ICD-10-CM | POA: Diagnosis not present

## 2018-10-29 DIAGNOSIS — R5383 Other fatigue: Secondary | ICD-10-CM | POA: Diagnosis not present

## 2018-10-29 DIAGNOSIS — J40 Bronchitis, not specified as acute or chronic: Secondary | ICD-10-CM | POA: Diagnosis not present

## 2018-10-29 DIAGNOSIS — R5381 Other malaise: Secondary | ICD-10-CM | POA: Diagnosis not present

## 2018-11-18 ENCOUNTER — Other Ambulatory Visit: Payer: Self-pay | Admitting: Obstetrics and Gynecology

## 2018-11-18 DIAGNOSIS — Z1231 Encounter for screening mammogram for malignant neoplasm of breast: Secondary | ICD-10-CM

## 2018-11-27 ENCOUNTER — Ambulatory Visit: Payer: PPO

## 2018-12-30 ENCOUNTER — Ambulatory Visit: Payer: PPO

## 2019-01-21 DIAGNOSIS — H2511 Age-related nuclear cataract, right eye: Secondary | ICD-10-CM | POA: Diagnosis not present

## 2019-01-28 DIAGNOSIS — H2511 Age-related nuclear cataract, right eye: Secondary | ICD-10-CM | POA: Diagnosis not present

## 2019-02-17 ENCOUNTER — Ambulatory Visit
Admission: RE | Admit: 2019-02-17 | Discharge: 2019-02-17 | Disposition: A | Discharge status: Home / Self Care | Payer: PPO | Source: Ambulatory Visit | Attending: Obstetrics and Gynecology | Admitting: Obstetrics and Gynecology

## 2019-02-17 ENCOUNTER — Other Ambulatory Visit: Payer: Self-pay

## 2019-02-17 DIAGNOSIS — Z1231 Encounter for screening mammogram for malignant neoplasm of breast: Secondary | ICD-10-CM

## 2019-02-24 DIAGNOSIS — M9901 Segmental and somatic dysfunction of cervical region: Secondary | ICD-10-CM | POA: Diagnosis not present

## 2019-02-24 DIAGNOSIS — M9903 Segmental and somatic dysfunction of lumbar region: Secondary | ICD-10-CM | POA: Diagnosis not present

## 2019-02-24 DIAGNOSIS — M461 Sacroiliitis, not elsewhere classified: Secondary | ICD-10-CM | POA: Diagnosis not present

## 2019-02-24 DIAGNOSIS — M9905 Segmental and somatic dysfunction of pelvic region: Secondary | ICD-10-CM | POA: Diagnosis not present

## 2019-02-24 DIAGNOSIS — M47896 Other spondylosis, lumbar region: Secondary | ICD-10-CM | POA: Diagnosis not present

## 2019-02-24 DIAGNOSIS — M9902 Segmental and somatic dysfunction of thoracic region: Secondary | ICD-10-CM | POA: Diagnosis not present

## 2019-02-24 DIAGNOSIS — M542 Cervicalgia: Secondary | ICD-10-CM | POA: Diagnosis not present

## 2019-02-26 DIAGNOSIS — M9902 Segmental and somatic dysfunction of thoracic region: Secondary | ICD-10-CM | POA: Diagnosis not present

## 2019-02-26 DIAGNOSIS — M461 Sacroiliitis, not elsewhere classified: Secondary | ICD-10-CM | POA: Diagnosis not present

## 2019-02-26 DIAGNOSIS — M9903 Segmental and somatic dysfunction of lumbar region: Secondary | ICD-10-CM | POA: Diagnosis not present

## 2019-02-26 DIAGNOSIS — M9905 Segmental and somatic dysfunction of pelvic region: Secondary | ICD-10-CM | POA: Diagnosis not present

## 2019-02-26 DIAGNOSIS — M47896 Other spondylosis, lumbar region: Secondary | ICD-10-CM | POA: Diagnosis not present

## 2019-02-26 DIAGNOSIS — M542 Cervicalgia: Secondary | ICD-10-CM | POA: Diagnosis not present

## 2019-02-26 DIAGNOSIS — M9901 Segmental and somatic dysfunction of cervical region: Secondary | ICD-10-CM | POA: Diagnosis not present

## 2019-03-02 DIAGNOSIS — M461 Sacroiliitis, not elsewhere classified: Secondary | ICD-10-CM | POA: Diagnosis not present

## 2019-03-02 DIAGNOSIS — M9902 Segmental and somatic dysfunction of thoracic region: Secondary | ICD-10-CM | POA: Diagnosis not present

## 2019-03-02 DIAGNOSIS — M9901 Segmental and somatic dysfunction of cervical region: Secondary | ICD-10-CM | POA: Diagnosis not present

## 2019-03-02 DIAGNOSIS — M9903 Segmental and somatic dysfunction of lumbar region: Secondary | ICD-10-CM | POA: Diagnosis not present

## 2019-03-02 DIAGNOSIS — M9905 Segmental and somatic dysfunction of pelvic region: Secondary | ICD-10-CM | POA: Diagnosis not present

## 2019-03-02 DIAGNOSIS — M47896 Other spondylosis, lumbar region: Secondary | ICD-10-CM | POA: Diagnosis not present

## 2019-03-02 DIAGNOSIS — M542 Cervicalgia: Secondary | ICD-10-CM | POA: Diagnosis not present

## 2019-03-05 DIAGNOSIS — M47896 Other spondylosis, lumbar region: Secondary | ICD-10-CM | POA: Diagnosis not present

## 2019-03-05 DIAGNOSIS — M461 Sacroiliitis, not elsewhere classified: Secondary | ICD-10-CM | POA: Diagnosis not present

## 2019-03-05 DIAGNOSIS — M9905 Segmental and somatic dysfunction of pelvic region: Secondary | ICD-10-CM | POA: Diagnosis not present

## 2019-03-05 DIAGNOSIS — M9902 Segmental and somatic dysfunction of thoracic region: Secondary | ICD-10-CM | POA: Diagnosis not present

## 2019-03-05 DIAGNOSIS — M9903 Segmental and somatic dysfunction of lumbar region: Secondary | ICD-10-CM | POA: Diagnosis not present

## 2019-03-05 DIAGNOSIS — M542 Cervicalgia: Secondary | ICD-10-CM | POA: Diagnosis not present

## 2019-03-05 DIAGNOSIS — M9901 Segmental and somatic dysfunction of cervical region: Secondary | ICD-10-CM | POA: Diagnosis not present

## 2019-03-09 DIAGNOSIS — M9905 Segmental and somatic dysfunction of pelvic region: Secondary | ICD-10-CM | POA: Diagnosis not present

## 2019-03-09 DIAGNOSIS — M542 Cervicalgia: Secondary | ICD-10-CM | POA: Diagnosis not present

## 2019-03-09 DIAGNOSIS — M461 Sacroiliitis, not elsewhere classified: Secondary | ICD-10-CM | POA: Diagnosis not present

## 2019-03-09 DIAGNOSIS — M9902 Segmental and somatic dysfunction of thoracic region: Secondary | ICD-10-CM | POA: Diagnosis not present

## 2019-03-09 DIAGNOSIS — M47896 Other spondylosis, lumbar region: Secondary | ICD-10-CM | POA: Diagnosis not present

## 2019-03-09 DIAGNOSIS — M9903 Segmental and somatic dysfunction of lumbar region: Secondary | ICD-10-CM | POA: Diagnosis not present

## 2019-03-09 DIAGNOSIS — M9901 Segmental and somatic dysfunction of cervical region: Secondary | ICD-10-CM | POA: Diagnosis not present

## 2019-03-12 DIAGNOSIS — M9902 Segmental and somatic dysfunction of thoracic region: Secondary | ICD-10-CM | POA: Diagnosis not present

## 2019-03-12 DIAGNOSIS — M9901 Segmental and somatic dysfunction of cervical region: Secondary | ICD-10-CM | POA: Diagnosis not present

## 2019-03-12 DIAGNOSIS — M47896 Other spondylosis, lumbar region: Secondary | ICD-10-CM | POA: Diagnosis not present

## 2019-03-12 DIAGNOSIS — M9903 Segmental and somatic dysfunction of lumbar region: Secondary | ICD-10-CM | POA: Diagnosis not present

## 2019-03-12 DIAGNOSIS — M542 Cervicalgia: Secondary | ICD-10-CM | POA: Diagnosis not present

## 2019-03-12 DIAGNOSIS — M9905 Segmental and somatic dysfunction of pelvic region: Secondary | ICD-10-CM | POA: Diagnosis not present

## 2019-03-12 DIAGNOSIS — M461 Sacroiliitis, not elsewhere classified: Secondary | ICD-10-CM | POA: Diagnosis not present

## 2019-03-16 DIAGNOSIS — M9903 Segmental and somatic dysfunction of lumbar region: Secondary | ICD-10-CM | POA: Diagnosis not present

## 2019-03-16 DIAGNOSIS — M9902 Segmental and somatic dysfunction of thoracic region: Secondary | ICD-10-CM | POA: Diagnosis not present

## 2019-03-16 DIAGNOSIS — M9901 Segmental and somatic dysfunction of cervical region: Secondary | ICD-10-CM | POA: Diagnosis not present

## 2019-03-16 DIAGNOSIS — M542 Cervicalgia: Secondary | ICD-10-CM | POA: Diagnosis not present

## 2019-03-16 DIAGNOSIS — M47896 Other spondylosis, lumbar region: Secondary | ICD-10-CM | POA: Diagnosis not present

## 2019-03-16 DIAGNOSIS — M461 Sacroiliitis, not elsewhere classified: Secondary | ICD-10-CM | POA: Diagnosis not present

## 2019-03-16 DIAGNOSIS — M9905 Segmental and somatic dysfunction of pelvic region: Secondary | ICD-10-CM | POA: Diagnosis not present

## 2019-03-19 DIAGNOSIS — M9903 Segmental and somatic dysfunction of lumbar region: Secondary | ICD-10-CM | POA: Diagnosis not present

## 2019-03-19 DIAGNOSIS — M9905 Segmental and somatic dysfunction of pelvic region: Secondary | ICD-10-CM | POA: Diagnosis not present

## 2019-03-19 DIAGNOSIS — M9902 Segmental and somatic dysfunction of thoracic region: Secondary | ICD-10-CM | POA: Diagnosis not present

## 2019-03-19 DIAGNOSIS — M542 Cervicalgia: Secondary | ICD-10-CM | POA: Diagnosis not present

## 2019-03-19 DIAGNOSIS — M47896 Other spondylosis, lumbar region: Secondary | ICD-10-CM | POA: Diagnosis not present

## 2019-03-19 DIAGNOSIS — M461 Sacroiliitis, not elsewhere classified: Secondary | ICD-10-CM | POA: Diagnosis not present

## 2019-03-19 DIAGNOSIS — M9901 Segmental and somatic dysfunction of cervical region: Secondary | ICD-10-CM | POA: Diagnosis not present

## 2019-03-24 DIAGNOSIS — M542 Cervicalgia: Secondary | ICD-10-CM | POA: Diagnosis not present

## 2019-03-24 DIAGNOSIS — M47896 Other spondylosis, lumbar region: Secondary | ICD-10-CM | POA: Diagnosis not present

## 2019-03-24 DIAGNOSIS — M461 Sacroiliitis, not elsewhere classified: Secondary | ICD-10-CM | POA: Diagnosis not present

## 2019-03-24 DIAGNOSIS — M9902 Segmental and somatic dysfunction of thoracic region: Secondary | ICD-10-CM | POA: Diagnosis not present

## 2019-03-24 DIAGNOSIS — M9901 Segmental and somatic dysfunction of cervical region: Secondary | ICD-10-CM | POA: Diagnosis not present

## 2019-03-24 DIAGNOSIS — M9903 Segmental and somatic dysfunction of lumbar region: Secondary | ICD-10-CM | POA: Diagnosis not present

## 2019-03-24 DIAGNOSIS — M9905 Segmental and somatic dysfunction of pelvic region: Secondary | ICD-10-CM | POA: Diagnosis not present

## 2019-04-14 DIAGNOSIS — M47896 Other spondylosis, lumbar region: Secondary | ICD-10-CM | POA: Diagnosis not present

## 2019-04-14 DIAGNOSIS — M461 Sacroiliitis, not elsewhere classified: Secondary | ICD-10-CM | POA: Diagnosis not present

## 2019-04-14 DIAGNOSIS — M9903 Segmental and somatic dysfunction of lumbar region: Secondary | ICD-10-CM | POA: Diagnosis not present

## 2019-04-14 DIAGNOSIS — M9901 Segmental and somatic dysfunction of cervical region: Secondary | ICD-10-CM | POA: Diagnosis not present

## 2019-04-14 DIAGNOSIS — M9905 Segmental and somatic dysfunction of pelvic region: Secondary | ICD-10-CM | POA: Diagnosis not present

## 2019-04-14 DIAGNOSIS — M542 Cervicalgia: Secondary | ICD-10-CM | POA: Diagnosis not present

## 2019-04-14 DIAGNOSIS — M9902 Segmental and somatic dysfunction of thoracic region: Secondary | ICD-10-CM | POA: Diagnosis not present

## 2019-04-21 DIAGNOSIS — M8588 Other specified disorders of bone density and structure, other site: Secondary | ICD-10-CM | POA: Diagnosis not present

## 2019-04-21 DIAGNOSIS — N958 Other specified menopausal and perimenopausal disorders: Secondary | ICD-10-CM | POA: Diagnosis not present

## 2019-04-24 DIAGNOSIS — E559 Vitamin D deficiency, unspecified: Secondary | ICD-10-CM | POA: Diagnosis not present

## 2019-04-24 DIAGNOSIS — R9389 Abnormal findings on diagnostic imaging of other specified body structures: Secondary | ICD-10-CM | POA: Diagnosis not present

## 2019-04-24 DIAGNOSIS — I341 Nonrheumatic mitral (valve) prolapse: Secondary | ICD-10-CM | POA: Diagnosis not present

## 2019-04-24 DIAGNOSIS — H402234 Chronic angle-closure glaucoma, bilateral, indeterminate stage: Secondary | ICD-10-CM | POA: Diagnosis not present

## 2019-04-24 DIAGNOSIS — M8580 Other specified disorders of bone density and structure, unspecified site: Secondary | ICD-10-CM | POA: Diagnosis not present

## 2019-04-24 DIAGNOSIS — E782 Mixed hyperlipidemia: Secondary | ICD-10-CM | POA: Diagnosis not present

## 2019-04-24 DIAGNOSIS — M8949 Other hypertrophic osteoarthropathy, multiple sites: Secondary | ICD-10-CM | POA: Diagnosis not present

## 2019-04-24 DIAGNOSIS — R030 Elevated blood-pressure reading, without diagnosis of hypertension: Secondary | ICD-10-CM | POA: Diagnosis not present

## 2019-04-24 DIAGNOSIS — J929 Pleural plaque without asbestos: Secondary | ICD-10-CM | POA: Diagnosis not present

## 2019-04-24 DIAGNOSIS — R5381 Other malaise: Secondary | ICD-10-CM | POA: Diagnosis not present

## 2019-04-24 DIAGNOSIS — R5383 Other fatigue: Secondary | ICD-10-CM | POA: Diagnosis not present

## 2019-05-05 DIAGNOSIS — M9905 Segmental and somatic dysfunction of pelvic region: Secondary | ICD-10-CM | POA: Diagnosis not present

## 2019-05-05 DIAGNOSIS — M461 Sacroiliitis, not elsewhere classified: Secondary | ICD-10-CM | POA: Diagnosis not present

## 2019-05-05 DIAGNOSIS — M542 Cervicalgia: Secondary | ICD-10-CM | POA: Diagnosis not present

## 2019-05-05 DIAGNOSIS — M9903 Segmental and somatic dysfunction of lumbar region: Secondary | ICD-10-CM | POA: Diagnosis not present

## 2019-05-05 DIAGNOSIS — M9901 Segmental and somatic dysfunction of cervical region: Secondary | ICD-10-CM | POA: Diagnosis not present

## 2019-05-05 DIAGNOSIS — M9902 Segmental and somatic dysfunction of thoracic region: Secondary | ICD-10-CM | POA: Diagnosis not present

## 2019-05-05 DIAGNOSIS — M47896 Other spondylosis, lumbar region: Secondary | ICD-10-CM | POA: Diagnosis not present

## 2019-05-06 DIAGNOSIS — R9389 Abnormal findings on diagnostic imaging of other specified body structures: Secondary | ICD-10-CM | POA: Diagnosis not present

## 2019-05-06 DIAGNOSIS — R5383 Other fatigue: Secondary | ICD-10-CM | POA: Diagnosis not present

## 2019-05-07 DIAGNOSIS — M79645 Pain in left finger(s): Secondary | ICD-10-CM | POA: Diagnosis not present

## 2019-05-07 DIAGNOSIS — M79644 Pain in right finger(s): Secondary | ICD-10-CM | POA: Insufficient documentation

## 2019-05-07 DIAGNOSIS — M65342 Trigger finger, left ring finger: Secondary | ICD-10-CM | POA: Diagnosis not present

## 2019-05-07 DIAGNOSIS — M6530 Trigger finger, unspecified finger: Secondary | ICD-10-CM | POA: Insufficient documentation

## 2019-05-26 DIAGNOSIS — J4520 Mild intermittent asthma, uncomplicated: Secondary | ICD-10-CM | POA: Diagnosis not present

## 2019-06-12 ENCOUNTER — Ambulatory Visit (INDEPENDENT_AMBULATORY_CARE_PROVIDER_SITE_OTHER): Payer: PPO | Admitting: Cardiology

## 2019-06-12 ENCOUNTER — Encounter: Payer: Self-pay | Admitting: Cardiology

## 2019-06-12 ENCOUNTER — Other Ambulatory Visit: Payer: Self-pay

## 2019-06-12 VITALS — BP 112/64 | HR 75 | Ht 66.0 in | Wt 167.6 lb

## 2019-06-12 DIAGNOSIS — E785 Hyperlipidemia, unspecified: Secondary | ICD-10-CM

## 2019-06-12 DIAGNOSIS — I341 Nonrheumatic mitral (valve) prolapse: Secondary | ICD-10-CM | POA: Diagnosis not present

## 2019-06-12 DIAGNOSIS — R030 Elevated blood-pressure reading, without diagnosis of hypertension: Secondary | ICD-10-CM

## 2019-06-12 DIAGNOSIS — E782 Mixed hyperlipidemia: Secondary | ICD-10-CM | POA: Diagnosis not present

## 2019-06-12 NOTE — Progress Notes (Signed)
Cardiology Consultation:    Date:  06/12/2019   ID:  Deanna Walsh, DOB 1945/11/20, MRN [id number]  PCP:  Raina Mina., MD  Cardiologist:  Jenne Campus, MD   Referring MD: Raina Mina., MD   Chief Complaint  Patient presents with  . Follow-up    History of Present Illness:    Deanna Walsh is a 73 y.o. female who is being seen today for the evaluation of mitral valve prolapse, mitral regurgitation at the request of Raina Mina., MD.  I been taking care of her more than 4 years ago she disappeared from follow-up and now she need to be seen.  She does describe to have some exertional shortness of breath but overall she said she still doing quite well.  She is very energetic she walks a lot she works a lot she can go fast with some shortness of breath.  No palpitations no dizziness no swelling of lower extremities.  She does not have proximal nocturnal dyspnea.  She does have dyslipidemia however her HDL is 67 LDL is 141.  Denies having any chest pain, tightness, pressure, burning in the chest.  No diagnosis of coronary artery disease.  She never smoked but she is does have secondhand smoking because her husband smoke.  Past Medical History:  Diagnosis Date  . Cancer ([place])    Breast Ca  . Collagen vascular disease ([place])   . Personal history of radiation therapy     Past Surgical History:  Procedure Laterality Date  . BREAST LUMPECTOMY Left   . CARPAL TUNNEL RELEASE    . CHOLECYSTECTOMY    . TONSILLECTOMY      Current Medications: Current Meds  Medication Sig  . acetaminophen (TYLENOL) 500 MG tablet Take 500 mg by mouth every 6 (six) hours as needed.  Marland Kitchen BLACK COHOSH EXTRACT PO Take by mouth.  . Calcium Carb-Cholecalciferol (CALCIUM 1000 + D PO) Take by mouth daily.  . Cholecalciferol (VITAMIN D-1000 MAX ST) 25 MCG (1000 UT) tablet Take 1 tablet by mouth daily.  Marland Kitchen ibuprofen (ADVIL) 200 MG tablet Take 200 mg by mouth every 6 (six) hours as needed for mild  pain.  Marland Kitchen latanoprost (XALATAN) 0.005 % ophthalmic solution 1 drop at bedtime.  . Methylsulfonylmethane (MSM PO) Take by mouth.     Allergies:   Patient has no known allergies.   Social History   Socioeconomic History  . Marital status: Married    Spouse name: Not on file  . Number of children: Not on file  . Years of education: Not on file  . Highest education level: Not on file  Occupational History  . Not on file  Social Needs  . Financial resource strain: Not on file  . Food insecurity    Worry: Not on file    Inability: Not on file  . Transportation needs    Medical: Not on file    Non-medical: Not on file  Tobacco Use  . Smoking status: Never Smoker  . Smokeless tobacco: Never Used  Substance and Sexual Activity  . Alcohol use: Not Currently  . Drug use: Never  . Sexual activity: Not on file  Lifestyle  . Physical activity    Days per week: Not on file    Minutes per session: Not on file  . Stress: Not on file  Relationships  . Social Herbalist on phone: Not on file    Gets together: Not on file  Attends religious service: Not on file    Active member of club or organization: Not on file    Attends meetings of clubs or organizations: Not on file    Relationship status: Not on file  Other Topics Concern  . Not on file  Social History Narrative  . Not on file     Family History: The patient's family history includes Cervical cancer in her mother; Diabetes in her father; Hypertension in her father. ROS:   Please see the history of present illness.    All 14 point review of systems negative except as described per history of present illness.  EKGs/Labs/Other Studies Reviewed:    The following studies were reviewed today:   EKG:  EKG is  ordered today.  The ekg ordered today demonstrates   Recent Labs: No results found for requested labs within last 8760 hours.  Recent Lipid Panel No results found for: CHOL, TRIG, HDL, CHOLHDL, VLDL,  LDLCALC, LDLDIRECT  Physical Exam:    VS:  BP 112/64   Pulse 75   Ht 5\' 6"  (1.676 m)   Wt 167 lb 9.6 oz (76 kg)   SpO2 99%   BMI 27.05 kg/m     Wt Readings from Last 3 Encounters:  06/12/19 167 lb 9.6 oz (76 kg)     GEN:  Well nourished, well developed in no acute distress HEENT: Normal NECK: No JVD; No carotid bruits LYMPHATICS: No lymphadenopathy CARDIAC: RRR, late holosystolic murmur grade 1/6 to 2/6 best heard at apex., no rubs, no gallops RESPIRATORY:  Clear to auscultation without rales, wheezing or rhonchi  ABDOMEN: Soft, non-tender, non-distended MUSCULOSKELETAL:  No edema; No deformity  SKIN: Warm and dry NEUROLOGIC:  Alert and oriented x 3 PSYCHIATRIC:  Normal affect   ASSESSMENT:    1. Mitral valve prolapse   2. Mixed hyperlipidemia   3. Elevated blood-pressure reading without diagnosis of hypertension   4. Dyslipidemia    PLAN:    In order of problems listed above:  1. Mitral valve prolapse.  We will repeat echocardiogram to look at the degree of mitral regurgitation caused by this on the physical examination there is only soft holosystolic murmur.  Also she does have only few symptoms that are somewhat concerning therefore I do not think we can end up finding some significant mitral regurgitation but the best way to determine this will be to do echocardiogram.  I will not give her any medications at the moment. 2. 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  Her chest x-ray however showed some questionable scarring in the apical pleura and second chest x-ray showed COPD.  I think beneficial for her would be to get calcium score and see if she got any calcification of her coronary arteries, if she does we need to start treating her more aggressively for cholesterol.  At the same time  this test will help [place] to determine if she truly got COPD and significant change in his lung.  I will leave it up to her primary care physician to determine which test is best for her in my opinion calcium score will be beneficial so if we decide to do a CT of her chest from my point of view it should be CT of her chest with no contrast.  If contrast is needed for pulmonary assessment  she need to have above CAT scan without contrast and with it. 3. Dyslipidemia discussion as above. 4. Hypertension her blood pressure is perfect today.  We will continue monitoring   Medication Adjustments/Labs and Tests Ordered: Current medicines are reviewed at length with the patient today.  Concerns regarding medicines are outlined above.  No orders of the defined types were placed in this encounter.  No orders of the defined types were placed in this encounter.   Signed, Park Liter, MD, [facility] 06/12/2019 2:20 PM    [facility]

## 2019-06-12 NOTE — Patient Instructions (Signed)
Medication Instructions:  Your physician recommends that you continue on your current medications as directed. Please refer to the Current Medication list given to you today.  If you need a refill on your cardiac medications before your next appointment, please call your pharmacy.   Lab work: None ordered If you have labs (blood work) drawn today and your tests are completely normal, you will receive your results only by: Marland Kitchen MyChart Message (if you have MyChart) OR . A paper copy in the mail If you have any lab test that is abnormal or we need to change your treatment, we will call you to review the results.  Testing/Procedures: Your physician has requested that you have an echocardiogram. Echocardiography is a painless test that uses sound waves to create images of your heart. It provides your doctor with information about the size and shape of your heart and how well your heart's chambers and valves are working. This procedure takes approximately one hour. There are no restrictions for this procedure.  EKG Today  Follow-Up: At [facility], you and your health needs are our priority.  As part of our continuing mission to provide you with exceptional heart care, we have created designated Provider Care Teams.  These Care Teams include your primary Cardiologist (physician) and Advanced Practice Providers (APPs -  Physician Assistants and Nurse Practitioners) who all work together to provide you with the care you need, when you need it. You will need a follow up appointment in 3 months.  Please call our office 2 months in advance to schedule this appointment.  You may see Jenne Campus or another member of our [employer] Provider Team in [place]: Shirlee More, MD . Jyl Heinz, MD

## 2019-06-22 DIAGNOSIS — J449 Chronic obstructive pulmonary disease, unspecified: Secondary | ICD-10-CM

## 2019-06-22 HISTORY — DX: Chronic obstructive pulmonary disease, unspecified: J44.9

## 2019-06-29 DIAGNOSIS — J449 Chronic obstructive pulmonary disease, unspecified: Secondary | ICD-10-CM | POA: Diagnosis not present

## 2019-06-29 DIAGNOSIS — Z09 Encounter for follow-up examination after completed treatment for conditions other than malignant neoplasm: Secondary | ICD-10-CM | POA: Diagnosis not present

## 2019-06-29 DIAGNOSIS — R918 Other nonspecific abnormal finding of lung field: Secondary | ICD-10-CM | POA: Diagnosis not present

## 2019-07-01 DIAGNOSIS — Z01419 Encounter for gynecological examination (general) (routine) without abnormal findings: Secondary | ICD-10-CM | POA: Diagnosis not present

## 2019-07-01 DIAGNOSIS — Z124 Encounter for screening for malignant neoplasm of cervix: Secondary | ICD-10-CM | POA: Diagnosis not present

## 2019-07-01 DIAGNOSIS — E041 Nontoxic single thyroid nodule: Secondary | ICD-10-CM | POA: Insufficient documentation

## 2019-07-01 DIAGNOSIS — M810 Age-related osteoporosis without current pathological fracture: Secondary | ICD-10-CM | POA: Diagnosis not present

## 2019-07-01 DIAGNOSIS — Z6826 Body mass index (BMI) 26.0-26.9, adult: Secondary | ICD-10-CM | POA: Diagnosis not present

## 2019-07-01 HISTORY — DX: Nontoxic single thyroid nodule: E04.1

## 2019-07-20 DIAGNOSIS — M79645 Pain in left finger(s): Secondary | ICD-10-CM | POA: Diagnosis not present

## 2019-07-20 DIAGNOSIS — M65332 Trigger finger, left middle finger: Secondary | ICD-10-CM | POA: Diagnosis not present

## 2019-07-21 ENCOUNTER — Ambulatory Visit (INDEPENDENT_AMBULATORY_CARE_PROVIDER_SITE_OTHER): Payer: PPO

## 2019-07-21 ENCOUNTER — Other Ambulatory Visit: Payer: Self-pay

## 2019-07-21 DIAGNOSIS — I341 Nonrheumatic mitral (valve) prolapse: Secondary | ICD-10-CM | POA: Diagnosis not present

## 2019-07-21 NOTE — Progress Notes (Signed)
Complete echocardiogram has been performed.  Jimmy Rasheema [place] RDCS, RVT 

## 2019-07-22 DIAGNOSIS — E041 Nontoxic single thyroid nodule: Secondary | ICD-10-CM | POA: Diagnosis not present

## 2019-07-24 ENCOUNTER — Telehealth: Payer: Self-pay | Admitting: Cardiology

## 2019-07-24 NOTE — Telephone Encounter (Signed)
Patient informed of results and scheduled for follow up appointment.  

## 2019-07-24 NOTE — Telephone Encounter (Signed)
Please call patient with echo results/LBW

## 2019-08-04 DIAGNOSIS — E041 Nontoxic single thyroid nodule: Secondary | ICD-10-CM | POA: Diagnosis not present

## 2019-08-18 DIAGNOSIS — E041 Nontoxic single thyroid nodule: Secondary | ICD-10-CM | POA: Diagnosis not present

## 2019-09-07 DIAGNOSIS — H401131 Primary open-angle glaucoma, bilateral, mild stage: Secondary | ICD-10-CM | POA: Diagnosis not present

## 2019-09-07 DIAGNOSIS — H353131 Nonexudative age-related macular degeneration, bilateral, early dry stage: Secondary | ICD-10-CM | POA: Diagnosis not present

## 2019-09-15 DIAGNOSIS — L918 Other hypertrophic disorders of the skin: Secondary | ICD-10-CM | POA: Diagnosis not present

## 2019-09-15 DIAGNOSIS — L300 Nummular dermatitis: Secondary | ICD-10-CM | POA: Diagnosis not present

## 2019-09-15 DIAGNOSIS — D1801 Hemangioma of skin and subcutaneous tissue: Secondary | ICD-10-CM | POA: Diagnosis not present

## 2019-09-15 DIAGNOSIS — L814 Other melanin hyperpigmentation: Secondary | ICD-10-CM | POA: Diagnosis not present

## 2019-09-15 DIAGNOSIS — D225 Melanocytic nevi of trunk: Secondary | ICD-10-CM | POA: Diagnosis not present

## 2019-09-22 ENCOUNTER — Encounter: Payer: Self-pay | Admitting: Cardiology

## 2019-09-22 ENCOUNTER — Other Ambulatory Visit: Payer: Self-pay

## 2019-09-22 ENCOUNTER — Ambulatory Visit (INDEPENDENT_AMBULATORY_CARE_PROVIDER_SITE_OTHER): Payer: PPO | Admitting: Cardiology

## 2019-09-22 VITALS — BP 140/80 | HR 84 | Ht 66.0 in | Wt 165.0 lb

## 2019-09-22 DIAGNOSIS — E782 Mixed hyperlipidemia: Secondary | ICD-10-CM | POA: Diagnosis not present

## 2019-09-22 DIAGNOSIS — I341 Nonrheumatic mitral (valve) prolapse: Secondary | ICD-10-CM

## 2019-09-22 DIAGNOSIS — E785 Hyperlipidemia, unspecified: Secondary | ICD-10-CM | POA: Diagnosis not present

## 2019-09-22 NOTE — Progress Notes (Signed)
Cardiology Office Note:    Date:  09/22/2019   ID:  KEEYANA DIAL, DOB [birth year], MRN [id number]  PCP:  Raina Mina., MD  Cardiologist:  Jenne Campus, MD    Referring MD: Raina Mina., MD   Chief Complaint  Patient presents with  . Follow-up    History of Present Illness:    Deanna Walsh is a 74 y.o. female with history of mitral valve prolapse, dyslipidemia, essential hypertension.  Comes today 2 months for follow-up.  Overall doing well.  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  Biopsy has been done and luckily it is a benign lesion.  Denies have any chest pain, tightness, pressure, burning in the chest.  Denies having any palpitations.  Her echocardiogram has been reviewed and showed myxomatous mitral valve but no significant mitral regurgitation no significant mitral valve prolapse.  No need to use prophylaxis against endocarditis.  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  We talked in length about potentially starting statin therapy for her dyslipidemia however she does not want to do it.  She prefers diet and exercise trying to get her cholesterol under control.  Therefore, I will schedule her to have fasting lipid profile done in 3 months.  We talked about healthy lifestyle need to exercise on a regular basis which he understand and she will do  Past Medical History:  Diagnosis Date  . Cancer ([place])    Breast Ca  . Collagen vascular disease ([place])   . Personal history of radiation therapy     Past Surgical History:  Procedure Laterality Date  . BREAST LUMPECTOMY Left   . CARPAL TUNNEL RELEASE    . CHOLECYSTECTOMY    . TONSILLECTOMY      Current Medications: Current Meds  Medication Sig  . acetaminophen (TYLENOL) 500 MG tablet Take 500 mg by mouth every 6 (six) hours as needed.  Marland Kitchen BLACK COHOSH EXTRACT PO  Take by mouth.  . Calcium Carb-Cholecalciferol (CALCIUM 1000 + D PO) Take by mouth daily.  . Cholecalciferol (VITAMIN D-1000 MAX ST) 25 MCG (1000 UT) tablet Take 1 tablet by mouth daily.  Marland Kitchen ibuprofen (ADVIL) 200 MG tablet Take 200 mg by mouth every 6 (six) hours as needed for mild pain.  Marland Kitchen latanoprost (XALATAN) 0.005 % ophthalmic solution 1 drop at bedtime.  . Methylsulfonylmethane (MSM PO) Take by mouth.     Allergies:   Patient has no known allergies.   Social History   Socioeconomic History  . Marital status: Married    Spouse name: Not on file  . Number of children: Not on file  . Years of education: Not on file  . Highest education level: Not on file  Occupational History  . Not on file  Tobacco Use  . Smoking status: Never Smoker  . Smokeless tobacco: Never Used  Substance and Sexual Activity  . Alcohol use: Not Currently  . Drug use: Never  . Sexual activity: Not on file  Other Topics Concern  . Not on file  Social History Narrative  . Not on file   Social Determinants of Health   Financial Resource Strain:   . Difficulty of Paying Living Expenses: Not on file  Food Insecurity:   . Worried About Charity fundraiser in the Last Year: Not on file  . Ran Out  of Food in the Last Year: Not on file  Transportation Needs:   . Lack of Transportation (Medical): Not on file  . Lack of Transportation (Non-Medical): Not on file  Physical Activity:   . Days of Exercise per Week: Not on file  . Minutes of Exercise per Session: Not on file  Stress:   . Feeling of Stress : Not on file  Social Connections:   . Frequency of Communication with Friends and Family: Not on file  . Frequency of Social Gatherings with Friends and Family: Not on file  . Attends Religious Services: Not on file  . Active Member of Clubs or Organizations: Not on file  . Attends Archivist Meetings: Not on file  . Marital Status: Not on file     Family History: The patient's family  history includes Cervical cancer in her mother; Diabetes in her father; Hypertension in her father. ROS:   Please see the history of present illness.    All 14 point review of systems negative except as described per history of present illness  EKGs/Labs/Other Studies Reviewed:      Recent Labs: No results found for requested labs within last 8760 hours.  Recent Lipid Panel No results found for: CHOL, TRIG, HDL, CHOLHDL, VLDL, LDLCALC, LDLDIRECT  Physical Exam:    VS:  BP 140/80   Pulse 84   Ht 5\' 6"  (1.676 m)   Wt 165 lb (74.8 kg)   SpO2 96%   BMI 26.63 kg/m     Wt Readings from Last 3 Encounters:  09/22/19 165 lb (74.8 kg)  06/12/19 167 lb 9.6 oz (76 kg)     GEN:  Well nourished, well developed in no acute distress HEENT: Normal NECK: No JVD; No carotid bruits LYMPHATICS: No lymphadenopathy CARDIAC: RRR, no murmurs, no rubs, no gallops RESPIRATORY:  Clear to auscultation without rales, wheezing or rhonchi  ABDOMEN: Soft, non-tender, non-distended MUSCULOSKELETAL:  No edema; No deformity  SKIN: Warm and dry LOWER EXTREMITIES: no swelling NEUROLOGIC:  Alert and oriented x 3 PSYCHIATRIC:  Normal affect   ASSESSMENT:    1. Dyslipidemia   2. Mitral valve prolapse   3. Mixed hyperlipidemia    PLAN:    In order of problems listed above:  1. Dyslipidemia plan as outlined above she prefers diet and exercise.  We will give her 3 months time to see if she can improve her cholesterol significantly but my feeling is that we end up probably with low intensity statin. 2. History of mitral valve prolapse but none of this being shown on latest echocardiogram.  Myxomatous valve is noted.  No need to intervene. 3. Follow overall discussion about healthy lifestyle with exercises and good diet.   Medication Adjustments/Labs and Tests Ordered: Current medicines are reviewed at length with the patient today.  Concerns regarding medicines are outlined above.  Orders Placed This  Encounter  Procedures  . Lipid Profile   Medication changes: No orders of the defined types were placed in this encounter.   Signed, Park Liter, MD, [facility] 09/22/2019 10:16 AM    [place]

## 2019-09-22 NOTE — Patient Instructions (Signed)
Medication Instructions:  Your physician recommends that you continue on your current medications as directed. Please refer to the Current Medication list given to you today.  *If you need a refill on your cardiac medications before your next appointment, please call your pharmacy*  Lab Work: Your physician recommends that you return for lab work in 3 months fasting: LIPIDS   If you have labs (blood work) drawn today and your tests are completely normal, you will receive your results only by: Marland Kitchen MyChart Message (if you have MyChart) OR . A paper copy in the mail If you have any lab test that is abnormal or we need to change your treatment, we will call you to review the results.  Testing/Procedures: None.   Follow-Up: At [facility], you and your health needs are our priority.  As part of our continuing mission to provide you with exceptional heart care, we have created designated Provider Care Teams.  These Care Teams include your primary Cardiologist (physician) and Advanced Practice Providers (APPs -  Physician Assistants and Nurse Practitioners) who all work together to provide you with the care you need, when you need it.  Your next appointment:   5 month(s)  The format for your next appointment:   In Person  Provider:   Jenne Campus, MD  Other Instructions

## 2019-10-12 DIAGNOSIS — Z4789 Encounter for other orthopedic aftercare: Secondary | ICD-10-CM | POA: Diagnosis not present

## 2019-10-12 DIAGNOSIS — M65332 Trigger finger, left middle finger: Secondary | ICD-10-CM | POA: Diagnosis not present

## 2019-10-26 DIAGNOSIS — M79645 Pain in left finger(s): Secondary | ICD-10-CM | POA: Diagnosis not present

## 2019-11-03 DIAGNOSIS — M79645 Pain in left finger(s): Secondary | ICD-10-CM | POA: Diagnosis not present

## 2019-11-04 DIAGNOSIS — R1084 Generalized abdominal pain: Secondary | ICD-10-CM | POA: Diagnosis not present

## 2019-11-04 DIAGNOSIS — R109 Unspecified abdominal pain: Secondary | ICD-10-CM | POA: Diagnosis not present

## 2019-11-09 DIAGNOSIS — M79645 Pain in left finger(s): Secondary | ICD-10-CM | POA: Diagnosis not present

## 2019-11-30 DIAGNOSIS — K5909 Other constipation: Secondary | ICD-10-CM | POA: Insufficient documentation

## 2019-11-30 DIAGNOSIS — L03114 Cellulitis of left upper limb: Secondary | ICD-10-CM | POA: Diagnosis not present

## 2019-11-30 HISTORY — DX: Other constipation: K59.09

## 2019-12-31 DIAGNOSIS — R030 Elevated blood-pressure reading, without diagnosis of hypertension: Secondary | ICD-10-CM | POA: Diagnosis not present

## 2019-12-31 DIAGNOSIS — H402234 Chronic angle-closure glaucoma, bilateral, indeterminate stage: Secondary | ICD-10-CM | POA: Diagnosis not present

## 2019-12-31 DIAGNOSIS — Z853 Personal history of malignant neoplasm of breast: Secondary | ICD-10-CM

## 2019-12-31 DIAGNOSIS — E041 Nontoxic single thyroid nodule: Secondary | ICD-10-CM | POA: Diagnosis not present

## 2019-12-31 DIAGNOSIS — E559 Vitamin D deficiency, unspecified: Secondary | ICD-10-CM | POA: Diagnosis not present

## 2019-12-31 DIAGNOSIS — K5909 Other constipation: Secondary | ICD-10-CM | POA: Diagnosis not present

## 2019-12-31 DIAGNOSIS — M8580 Other specified disorders of bone density and structure, unspecified site: Secondary | ICD-10-CM | POA: Diagnosis not present

## 2019-12-31 DIAGNOSIS — R5381 Other malaise: Secondary | ICD-10-CM | POA: Diagnosis not present

## 2019-12-31 DIAGNOSIS — R5383 Other fatigue: Secondary | ICD-10-CM | POA: Diagnosis not present

## 2019-12-31 DIAGNOSIS — I341 Nonrheumatic mitral (valve) prolapse: Secondary | ICD-10-CM | POA: Diagnosis not present

## 2019-12-31 DIAGNOSIS — M8949 Other hypertrophic osteoarthropathy, multiple sites: Secondary | ICD-10-CM | POA: Diagnosis not present

## 2019-12-31 DIAGNOSIS — J449 Chronic obstructive pulmonary disease, unspecified: Secondary | ICD-10-CM | POA: Diagnosis not present

## 2019-12-31 DIAGNOSIS — Z0000 Encounter for general adult medical examination without abnormal findings: Secondary | ICD-10-CM | POA: Diagnosis not present

## 2019-12-31 DIAGNOSIS — Z79899 Other long term (current) drug therapy: Secondary | ICD-10-CM | POA: Diagnosis not present

## 2019-12-31 DIAGNOSIS — E782 Mixed hyperlipidemia: Secondary | ICD-10-CM | POA: Diagnosis not present

## 2019-12-31 HISTORY — DX: Personal history of malignant neoplasm of breast: Z85.3

## 2020-01-14 ENCOUNTER — Other Ambulatory Visit: Payer: Self-pay | Admitting: Obstetrics and Gynecology

## 2020-01-14 DIAGNOSIS — Z1231 Encounter for screening mammogram for malignant neoplasm of breast: Secondary | ICD-10-CM

## 2020-02-18 ENCOUNTER — Ambulatory Visit: Payer: PPO

## 2020-02-23 ENCOUNTER — Other Ambulatory Visit: Payer: Self-pay

## 2020-02-23 ENCOUNTER — Ambulatory Visit
Admission: RE | Admit: 2020-02-23 | Discharge: 2020-02-23 | Disposition: A | Discharge status: Home / Self Care | Payer: PPO | Source: Ambulatory Visit | Attending: Obstetrics and Gynecology | Admitting: Obstetrics and Gynecology

## 2020-02-23 DIAGNOSIS — Z1231 Encounter for screening mammogram for malignant neoplasm of breast: Secondary | ICD-10-CM

## 2020-03-04 ENCOUNTER — Other Ambulatory Visit: Payer: Self-pay

## 2020-03-10 ENCOUNTER — Ambulatory Visit: Payer: PPO | Admitting: Cardiology

## 2020-03-10 ENCOUNTER — Encounter: Payer: Self-pay | Admitting: Cardiology

## 2020-03-10 ENCOUNTER — Other Ambulatory Visit: Payer: Self-pay

## 2020-03-10 VITALS — BP 130/68 | HR 76 | Ht 66.0 in | Wt 163.4 lb

## 2020-03-10 DIAGNOSIS — E782 Mixed hyperlipidemia: Secondary | ICD-10-CM

## 2020-03-10 DIAGNOSIS — R079 Chest pain, unspecified: Secondary | ICD-10-CM

## 2020-03-10 DIAGNOSIS — I341 Nonrheumatic mitral (valve) prolapse: Secondary | ICD-10-CM

## 2020-03-10 DIAGNOSIS — R0600 Dyspnea, unspecified: Secondary | ICD-10-CM | POA: Diagnosis not present

## 2020-03-10 DIAGNOSIS — R0609 Other forms of dyspnea: Secondary | ICD-10-CM

## 2020-03-10 DIAGNOSIS — E785 Hyperlipidemia, unspecified: Secondary | ICD-10-CM

## 2020-03-10 HISTORY — DX: Dyspnea, unspecified: R06.00

## 2020-03-10 HISTORY — DX: Other forms of dyspnea: R06.09

## 2020-03-10 NOTE — Patient Instructions (Signed)
Medication Instructions:  Your physician recommends that you continue on your current medications as directed. Please refer to the Current Medication list given to you today.  *If you need a refill on your cardiac medications before your next appointment, please call your pharmacy*   Lab Work: None.  If you have labs (blood work) drawn today and your tests are completely normal, you will receive your results only by: Marland Kitchen MyChart Message (if you have MyChart) OR . A paper copy in the mail If you have any lab test that is abnormal or we need to change your treatment, we will call you to review the results.   Testing/Procedures: Your physician has requested that you have a carotid duplex. This test is an ultrasound of the carotid arteries in your neck. It looks at blood flow through these arteries that supply the brain with blood. Allow one hour for this exam. There are no restrictions or special instructions.    [facility] [address] [place], [place] [address] Phone:  [phone]    Please arrive 15 minutes prior to your appointment time for registration and insurance purposes.  The test will take approximately 3 to 4 hours to complete; you may bring reading material.  If someone comes with you to your appointment, they will need to remain in the main lobby due to limited space in the testing area. **If you are pregnant or breastfeeding, please notify the nuclear lab prior to your appointment**  How to prepare for your Myocardial Perfusion Test: . Do not eat or drink 3 hours prior to your test, except you may have water. . Do not consume products containing caffeine (regular or decaffeinated) 12 hours prior to your test. (ex: coffee, chocolate, sodas, tea). . Do bring a list of your current medications with you.  If not listed below, you may take your medications as normal. . Do wear comfortable clothes (no dresses or overalls) and walking shoes, tennis  shoes preferred (No heels or open toe shoes are allowed). . Do NOT wear cologne, perfume, aftershave, or lotions (deodorant is allowed). . If these instructions are not followed, your test will have to be rescheduled.  Please report to [address] for your test.  If you have questions or concerns about your appointment, you can call the [facility] at [phone].  If you cannot keep your appointment, please provide 24 hours notification to the Nuclear Lab, to avoid a possible $50 charge to your account.    Follow-Up: At [facility], you and your health needs are our priority.  As part of our continuing mission to provide you with exceptional heart care, we have created designated Provider Care Teams.  These Care Teams include your primary Cardiologist (physician) and Advanced Practice Providers (APPs -  Physician Assistants and Nurse Practitioners) who all work together to provide you with the care you need, when you need it.  We recommend signing up for the patient portal called "MyChart".  Sign up information is provided on this After Visit Summary.  MyChart is used to connect with patients for Virtual Visits (Telemedicine).  Patients are able to view lab/test results, encounter notes, upcoming appointments, etc.  Non-urgent messages can be sent to your provider as well.   To learn more about what you can do with MyChart, go to [email or web address].    Your next appointment:   5 month(s)  The format for your next appointment:   In Person  Provider:   Jenne Campus, MD   Other Instructions   Cardiac Nuclear Scan A cardiac nuclear scan is a test that measures blood flow to the heart when a person is resting and when he or she is exercising. The test looks for problems such as:  Not enough blood reaching a portion of the heart.  The heart muscle not working normally. You may need this test if:  You have heart disease.  You have  had abnormal lab results.  You have had heart surgery or a balloon procedure to open up blocked arteries (angioplasty).  You have chest pain.  You have shortness of breath. In this test, a radioactive dye (tracer) is injected into your bloodstream. After the tracer has traveled to your heart, an imaging device is used to measure how much of the tracer is absorbed by or distributed to various areas of your heart. This procedure is usually done at a hospital and takes 2-4 hours. Tell a health care provider about:  Any allergies you have.  All medicines you are taking, including vitamins, herbs, eye drops, creams, and over-the-counter medicines.  Any problems you or family members have had with anesthetic medicines.  Any blood disorders you have.  Any surgeries you have had.  Any medical conditions you have.  Whether you are pregnant or may be pregnant. What are the risks? Generally, this is a safe procedure. However, problems may occur, including:  Serious chest pain and heart attack. This is only a risk if the stress portion of the test is done.  Rapid heartbeat.  Sensation of warmth in your chest. This usually passes quickly.  Allergic reaction to the tracer. What happens before the procedure?  Ask your health care provider about changing or stopping your regular medicines. This is especially important if you are taking diabetes medicines or blood thinners.  Follow instructions from your health care provider about eating or drinking restrictions.  Remove your jewelry on the day of the procedure. What happens during the procedure?  An IV will be inserted into one of your veins.  Your health care provider will inject a small amount of radioactive tracer through the IV.  You will wait for 20-40 minutes while the tracer travels through your bloodstream.  Your heart activity will be monitored with an electrocardiogram (ECG).  You will lie down on an exam table.  Images  of your heart will be taken for about 15-20 minutes.  You may also have a stress test. For this test, one of the following may be done: ? You will exercise on a treadmill or stationary bike. While you exercise, your heart's activity will be monitored with an ECG, and your blood pressure will be checked. ? You will be given medicines that will increase blood flow to parts of your heart. This is done if you are unable to exercise.  When blood flow to your heart has peaked, a tracer will again be injected through the IV.  After 20-40 minutes, you will get back on the exam table and have more images taken of your heart.  Depending on the type of tracer used, scans may need to be repeated 3-4 hours later.  Your IV line will be removed when the procedure is over. The procedure may vary among health care providers and hospitals. What happens after the procedure?  Unless your health care provider tells you otherwise, you may return to your normal schedule, including diet, activities, and medicines.  Unless your health care  provider tells you otherwise, you may increase your fluid intake. This will help to flush the contrast dye from your body. Drink enough fluid to keep your urine pale yellow.  Ask your health care provider, or the department that is doing the test: ? When will my results be ready? ? How will I get my results? Summary  A cardiac nuclear scan measures the blood flow to the heart when a person is resting and when he or she is exercising.  Tell your health care provider if you are pregnant.  Before the procedure, ask your health care provider about changing or stopping your regular medicines. This is especially important if you are taking diabetes medicines or blood thinners.  After the procedure, unless your health care provider tells you otherwise, increase your fluid intake. This will help flush the contrast dye from your body.  After the procedure, unless your health care  provider tells you otherwise, you may return to your normal schedule, including diet, activities, and medicines. This information is not intended to replace advice given to you by your health care provider. Make sure you discuss any questions you have with your health care provider. Document Revised: 02/10/2018 Document Reviewed: 02/10/2018 Elsevier Patient Education  [place].

## 2020-03-10 NOTE — Progress Notes (Signed)
Cardiology Office Note:    Date:  03/10/2020   ID:  Deanna Walsh, DOB Oct 11, 1945, MRN [id number]  PCP:  Raina Mina., MD  Cardiologist:  Jenne Campus, MD    Referring MD: Raina Mina., MD   No chief complaint on file. I am still weak and tired  History of Present Illness:    Deanna Walsh is a 74 y.o. female with past medical history significant for mitral valve prolapse, however, last echocardiogram did not show any significant mitral valve prolapse or regurgitation.  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  She comes today 2 months follow-up overall complain of being weak tired and exhausted and this is a leading complaint.  She did have quite extensive evaluation trying to find out what the reason for fatigue and tiredness is.  That include pulmonary function test, quite extensive laboratory tests all were practically normal.  She does have elevation of lipids.  She still dissatisfied when she feels  Past Medical History:  Diagnosis Date  . Cancer ([place])    Breast Ca  . Collagen vascular disease ([place])   . Personal history of radiation therapy     Past Surgical History:  Procedure Laterality Date  . BREAST LUMPECTOMY Left 1999  . CARPAL TUNNEL RELEASE    . CHOLECYSTECTOMY    . TONSILLECTOMY      Current Medications: Current Meds  Medication Sig  . aspirin-acetaminophen-caffeine (EXCEDRIN MIGRAINE) 250-250-65 MG tablet Take 1 tablet by mouth every 6 (six) hours as needed for headache.  Marland Kitchen BLACK COHOSH EXTRACT PO Take by mouth.  . Calcium Carb-Cholecalciferol (CALCIUM 1000 + D PO) Take by mouth daily.  . Flaxseed, Linseed, (FLAXSEED OIL) 1000 MG CAPS Take 1 capsule by mouth 2 (two) times daily.  Marland Kitchen latanoprost (XALATAN) 0.005 % ophthalmic solution 1 drop at bedtime.  . Methylsulfonylmethane (MSM PO) Take by mouth.     Allergies:   Patient has no known allergies.   Social History   Socioeconomic  History  . Marital status: Married    Spouse name: Not on file  . Number of children: Not on file  . Years of education: Not on file  . Highest education level: Not on file  Occupational History  . Not on file  Tobacco Use  . Smoking status: Never Smoker  . Smokeless tobacco: Never Used  Vaping Use  . Vaping Use: Never used  Substance and Sexual Activity  . Alcohol use: Not Currently  . Drug use: Never  . Sexual activity: Not on file  Other Topics Concern  . Not on file  Social History Narrative  . Not on file   Social Determinants of Health   Financial Resource Strain:   . Difficulty of Paying Living Expenses:   Food Insecurity:   . Worried About Charity fundraiser in the Last Year:   . Arboriculturist in the Last Year:   Transportation Needs:   . Film/video editor (Medical):   Marland Kitchen Lack of Transportation (Non-Medical):   Physical Activity:   . Days of Exercise per Week:   . Minutes of Exercise per Session:   Stress:   . Feeling of Stress :   Social Connections:   . Frequency of Communication with Friends and Family:   . Frequency of Social Gatherings with Friends and Family:   . Attends Religious Services:   . Active Member of Clubs or Organizations:   . Attends  Club or Organization Meetings:   Marland Kitchen Marital Status:      Family History: The patient's family history includes Cervical cancer in her mother; Diabetes in her father; Hypertension in her father. ROS:   Please see the history of present illness.    All 14 point review of systems negative except as described per history of present illness  EKGs/Labs/Other Studies Reviewed:      Recent Labs: No results found for requested labs within last 8760 hours.  Recent Lipid Panel No results found for: CHOL, TRIG, HDL, CHOLHDL, VLDL, LDLCALC, LDLDIRECT  Physical Exam:    VS:  BP 130/68 (BP Location: Right Arm, Patient Position: Standing, Cuff Size: Normal)   Pulse 76   Ht 5\' 6"  (1.676 m)   Wt 163 lb 6.4  oz (74.1 kg)   SpO2 90%   BMI 26.37 kg/m     Wt Readings from Last 3 Encounters:  03/10/20 163 lb 6.4 oz (74.1 kg)  09/22/19 165 lb (74.8 kg)  06/12/19 167 lb 9.6 oz (76 kg)     GEN:  Well nourished, well developed in no acute distress HEENT: Normal NECK: No JVD; No carotid bruits LYMPHATICS: No lymphadenopathy CARDIAC: RRR, no murmurs, no rubs, no gallops RESPIRATORY:  Clear to auscultation without rales, wheezing or rhonchi  ABDOMEN: Soft, non-tender, non-distended MUSCULOSKELETAL:  No edema; No deformity  SKIN: Warm and dry LOWER EXTREMITIES: no swelling NEUROLOGIC:  Alert and oriented x 3 PSYCHIATRIC:  Normal affect   ASSESSMENT:    1. Chest pain of uncertain etiology   2. Dyslipidemia   3. Mitral valve prolapse   4. Mixed hyperlipidemia   5. Dyspnea on exertion    PLAN:    In order of problems listed above:  1. Chest pain, atypical.  Most shortness of breath are more about angina equivalent.  I have her stress test, she accepted.  We will do Lexiscan. 2. Dyslipidemia: I did calculate 10 years risk for cardiac event which came in the intermediate range.  I offer her statin, she declined. 3. Mitral valve prolapse, last echocardiogram did not show significant mitral process however mitral valve is myxomatous.  No regurgitation. 4. Dyslipidemia still and answer etiology.  Stress test will be done to rule it out.  I will also schedule him to have carotic ultrasounds since she does have some calcification of her arteries.   Medication Adjustments/Labs and Tests Ordered: Current medicines are reviewed at length with the patient today.  Concerns regarding medicines are outlined above.  Orders Placed This Encounter  Procedures  . MYOCARDIAL PERFUSION IMAGING  . VAS US CAROTID   Medication changes: No orders of the defined types were placed in this encounter.   Signed, Park Liter, MD, [facility] 03/10/2020 12:15 PM    [place]

## 2020-03-29 ENCOUNTER — Other Ambulatory Visit: Payer: Self-pay

## 2020-03-29 ENCOUNTER — Ambulatory Visit (INDEPENDENT_AMBULATORY_CARE_PROVIDER_SITE_OTHER): Payer: PPO

## 2020-03-29 DIAGNOSIS — E785 Hyperlipidemia, unspecified: Secondary | ICD-10-CM

## 2020-03-29 DIAGNOSIS — R079 Chest pain, unspecified: Secondary | ICD-10-CM | POA: Diagnosis not present

## 2020-03-29 NOTE — Progress Notes (Signed)
Carotid duplex exam perform.  Jimmy Kipp Shank RDCS, RVT

## 2020-04-01 ENCOUNTER — Encounter: Payer: Self-pay | Admitting: Emergency Medicine

## 2020-04-05 DIAGNOSIS — M9903 Segmental and somatic dysfunction of lumbar region: Secondary | ICD-10-CM | POA: Diagnosis not present

## 2020-04-05 DIAGNOSIS — M47896 Other spondylosis, lumbar region: Secondary | ICD-10-CM | POA: Diagnosis not present

## 2020-04-05 DIAGNOSIS — M9901 Segmental and somatic dysfunction of cervical region: Secondary | ICD-10-CM | POA: Diagnosis not present

## 2020-04-05 DIAGNOSIS — M461 Sacroiliitis, not elsewhere classified: Secondary | ICD-10-CM | POA: Diagnosis not present

## 2020-04-05 DIAGNOSIS — M9905 Segmental and somatic dysfunction of pelvic region: Secondary | ICD-10-CM | POA: Diagnosis not present

## 2020-04-05 DIAGNOSIS — M542 Cervicalgia: Secondary | ICD-10-CM | POA: Diagnosis not present

## 2020-04-05 DIAGNOSIS — M9902 Segmental and somatic dysfunction of thoracic region: Secondary | ICD-10-CM | POA: Diagnosis not present

## 2020-04-08 DIAGNOSIS — R0781 Pleurodynia: Secondary | ICD-10-CM | POA: Diagnosis not present

## 2020-04-12 DIAGNOSIS — M542 Cervicalgia: Secondary | ICD-10-CM | POA: Diagnosis not present

## 2020-04-12 DIAGNOSIS — M9905 Segmental and somatic dysfunction of pelvic region: Secondary | ICD-10-CM | POA: Diagnosis not present

## 2020-04-12 DIAGNOSIS — M9901 Segmental and somatic dysfunction of cervical region: Secondary | ICD-10-CM | POA: Diagnosis not present

## 2020-04-12 DIAGNOSIS — M545 Low back pain: Secondary | ICD-10-CM | POA: Diagnosis not present

## 2020-04-12 DIAGNOSIS — M9903 Segmental and somatic dysfunction of lumbar region: Secondary | ICD-10-CM | POA: Diagnosis not present

## 2020-04-12 DIAGNOSIS — M47896 Other spondylosis, lumbar region: Secondary | ICD-10-CM | POA: Diagnosis not present

## 2020-04-12 DIAGNOSIS — M9902 Segmental and somatic dysfunction of thoracic region: Secondary | ICD-10-CM | POA: Diagnosis not present

## 2020-04-14 DIAGNOSIS — M9905 Segmental and somatic dysfunction of pelvic region: Secondary | ICD-10-CM | POA: Diagnosis not present

## 2020-04-14 DIAGNOSIS — M542 Cervicalgia: Secondary | ICD-10-CM | POA: Diagnosis not present

## 2020-04-14 DIAGNOSIS — M9901 Segmental and somatic dysfunction of cervical region: Secondary | ICD-10-CM | POA: Diagnosis not present

## 2020-04-14 DIAGNOSIS — M9903 Segmental and somatic dysfunction of lumbar region: Secondary | ICD-10-CM | POA: Diagnosis not present

## 2020-04-14 DIAGNOSIS — M545 Low back pain: Secondary | ICD-10-CM | POA: Diagnosis not present

## 2020-04-14 DIAGNOSIS — M47896 Other spondylosis, lumbar region: Secondary | ICD-10-CM | POA: Diagnosis not present

## 2020-04-14 DIAGNOSIS — M9902 Segmental and somatic dysfunction of thoracic region: Secondary | ICD-10-CM | POA: Diagnosis not present

## 2020-04-19 DIAGNOSIS — M9905 Segmental and somatic dysfunction of pelvic region: Secondary | ICD-10-CM | POA: Diagnosis not present

## 2020-04-19 DIAGNOSIS — M47896 Other spondylosis, lumbar region: Secondary | ICD-10-CM | POA: Diagnosis not present

## 2020-04-19 DIAGNOSIS — M9901 Segmental and somatic dysfunction of cervical region: Secondary | ICD-10-CM | POA: Diagnosis not present

## 2020-04-19 DIAGNOSIS — M542 Cervicalgia: Secondary | ICD-10-CM | POA: Diagnosis not present

## 2020-04-19 DIAGNOSIS — M545 Low back pain: Secondary | ICD-10-CM | POA: Diagnosis not present

## 2020-04-19 DIAGNOSIS — M9903 Segmental and somatic dysfunction of lumbar region: Secondary | ICD-10-CM | POA: Diagnosis not present

## 2020-04-19 DIAGNOSIS — M9902 Segmental and somatic dysfunction of thoracic region: Secondary | ICD-10-CM | POA: Diagnosis not present

## 2020-04-26 DIAGNOSIS — M9903 Segmental and somatic dysfunction of lumbar region: Secondary | ICD-10-CM | POA: Diagnosis not present

## 2020-04-26 DIAGNOSIS — M542 Cervicalgia: Secondary | ICD-10-CM | POA: Diagnosis not present

## 2020-04-26 DIAGNOSIS — M9905 Segmental and somatic dysfunction of pelvic region: Secondary | ICD-10-CM | POA: Diagnosis not present

## 2020-04-26 DIAGNOSIS — M9902 Segmental and somatic dysfunction of thoracic region: Secondary | ICD-10-CM | POA: Diagnosis not present

## 2020-04-26 DIAGNOSIS — M9901 Segmental and somatic dysfunction of cervical region: Secondary | ICD-10-CM | POA: Diagnosis not present

## 2020-04-26 DIAGNOSIS — M47896 Other spondylosis, lumbar region: Secondary | ICD-10-CM | POA: Diagnosis not present

## 2020-04-26 DIAGNOSIS — M545 Low back pain: Secondary | ICD-10-CM | POA: Diagnosis not present

## 2020-05-03 DIAGNOSIS — M9901 Segmental and somatic dysfunction of cervical region: Secondary | ICD-10-CM | POA: Diagnosis not present

## 2020-05-03 DIAGNOSIS — M47896 Other spondylosis, lumbar region: Secondary | ICD-10-CM | POA: Diagnosis not present

## 2020-05-03 DIAGNOSIS — M9903 Segmental and somatic dysfunction of lumbar region: Secondary | ICD-10-CM | POA: Diagnosis not present

## 2020-05-03 DIAGNOSIS — M9905 Segmental and somatic dysfunction of pelvic region: Secondary | ICD-10-CM | POA: Diagnosis not present

## 2020-05-03 DIAGNOSIS — M9902 Segmental and somatic dysfunction of thoracic region: Secondary | ICD-10-CM | POA: Diagnosis not present

## 2020-05-03 DIAGNOSIS — M542 Cervicalgia: Secondary | ICD-10-CM | POA: Diagnosis not present

## 2020-05-03 DIAGNOSIS — M545 Low back pain: Secondary | ICD-10-CM | POA: Diagnosis not present

## 2020-05-20 ENCOUNTER — Encounter: Payer: Self-pay | Admitting: Cardiology

## 2020-05-24 DIAGNOSIS — M542 Cervicalgia: Secondary | ICD-10-CM | POA: Diagnosis not present

## 2020-05-24 DIAGNOSIS — M9905 Segmental and somatic dysfunction of pelvic region: Secondary | ICD-10-CM | POA: Diagnosis not present

## 2020-05-24 DIAGNOSIS — M9902 Segmental and somatic dysfunction of thoracic region: Secondary | ICD-10-CM | POA: Diagnosis not present

## 2020-05-24 DIAGNOSIS — M9901 Segmental and somatic dysfunction of cervical region: Secondary | ICD-10-CM | POA: Diagnosis not present

## 2020-05-24 DIAGNOSIS — M9903 Segmental and somatic dysfunction of lumbar region: Secondary | ICD-10-CM | POA: Diagnosis not present

## 2020-05-24 DIAGNOSIS — M47896 Other spondylosis, lumbar region: Secondary | ICD-10-CM | POA: Diagnosis not present

## 2020-05-24 DIAGNOSIS — M545 Low back pain: Secondary | ICD-10-CM | POA: Diagnosis not present

## 2020-06-07 DIAGNOSIS — M9902 Segmental and somatic dysfunction of thoracic region: Secondary | ICD-10-CM | POA: Diagnosis not present

## 2020-06-07 DIAGNOSIS — M542 Cervicalgia: Secondary | ICD-10-CM | POA: Diagnosis not present

## 2020-06-07 DIAGNOSIS — M9903 Segmental and somatic dysfunction of lumbar region: Secondary | ICD-10-CM | POA: Diagnosis not present

## 2020-06-07 DIAGNOSIS — M9901 Segmental and somatic dysfunction of cervical region: Secondary | ICD-10-CM | POA: Diagnosis not present

## 2020-06-07 DIAGNOSIS — M9905 Segmental and somatic dysfunction of pelvic region: Secondary | ICD-10-CM | POA: Diagnosis not present

## 2020-06-07 DIAGNOSIS — M545 Low back pain: Secondary | ICD-10-CM | POA: Diagnosis not present

## 2020-06-07 DIAGNOSIS — M47896 Other spondylosis, lumbar region: Secondary | ICD-10-CM | POA: Diagnosis not present

## 2020-06-21 ENCOUNTER — Encounter: Payer: Self-pay | Admitting: Cardiology

## 2020-07-05 ENCOUNTER — Telehealth (HOSPITAL_COMMUNITY): Payer: Self-pay | Admitting: *Deleted

## 2020-07-05 DIAGNOSIS — R5383 Other fatigue: Secondary | ICD-10-CM | POA: Diagnosis not present

## 2020-07-05 DIAGNOSIS — M8580 Other specified disorders of bone density and structure, unspecified site: Secondary | ICD-10-CM | POA: Diagnosis not present

## 2020-07-05 DIAGNOSIS — H402234 Chronic angle-closure glaucoma, bilateral, indeterminate stage: Secondary | ICD-10-CM | POA: Diagnosis not present

## 2020-07-05 DIAGNOSIS — R5381 Other malaise: Secondary | ICD-10-CM | POA: Diagnosis not present

## 2020-07-05 DIAGNOSIS — E782 Mixed hyperlipidemia: Secondary | ICD-10-CM | POA: Diagnosis not present

## 2020-07-05 DIAGNOSIS — Z0000 Encounter for general adult medical examination without abnormal findings: Secondary | ICD-10-CM | POA: Diagnosis not present

## 2020-07-05 DIAGNOSIS — I341 Nonrheumatic mitral (valve) prolapse: Secondary | ICD-10-CM | POA: Diagnosis not present

## 2020-07-05 DIAGNOSIS — R030 Elevated blood-pressure reading, without diagnosis of hypertension: Secondary | ICD-10-CM | POA: Diagnosis not present

## 2020-07-05 DIAGNOSIS — K5909 Other constipation: Secondary | ICD-10-CM | POA: Diagnosis not present

## 2020-07-05 DIAGNOSIS — E041 Nontoxic single thyroid nodule: Secondary | ICD-10-CM | POA: Diagnosis not present

## 2020-07-05 DIAGNOSIS — J449 Chronic obstructive pulmonary disease, unspecified: Secondary | ICD-10-CM | POA: Diagnosis not present

## 2020-07-05 NOTE — Telephone Encounter (Signed)
Patient given detailed instructions per Myocardial Perfusion Study Information Sheet for the test on 07/13/20. Patient notified to arrive 15 minutes early and that it is imperative to arrive on time for appointment to keep from having the test rescheduled.  If you need to cancel or reschedule your appointment, please call the office within 24 hours of your appointment. . Patient verbalized understanding. Kirstie Peri

## 2020-07-06 DIAGNOSIS — R5383 Other fatigue: Secondary | ICD-10-CM | POA: Diagnosis not present

## 2020-07-06 DIAGNOSIS — R5381 Other malaise: Secondary | ICD-10-CM | POA: Diagnosis not present

## 2020-07-06 DIAGNOSIS — E782 Mixed hyperlipidemia: Secondary | ICD-10-CM | POA: Diagnosis not present

## 2020-07-12 ENCOUNTER — Other Ambulatory Visit: Payer: Self-pay

## 2020-07-12 ENCOUNTER — Ambulatory Visit (INDEPENDENT_AMBULATORY_CARE_PROVIDER_SITE_OTHER): Payer: PPO

## 2020-07-12 DIAGNOSIS — R079 Chest pain, unspecified: Secondary | ICD-10-CM | POA: Diagnosis not present

## 2020-07-12 LAB — MYOCARDIAL PERFUSION IMAGING
LV dias vol: 73 mL (ref 46–106)
LV sys vol: 22 mL
Peak HR: 107 {beats}/min
Rest HR: 79 {beats}/min
SDS: 0
SRS: 1
SSS: 1
TID: 1.1

## 2020-07-12 MED ORDER — TECHNETIUM TC 99M TETROFOSMIN IV KIT
32.3000 | PACK | Freq: Once | INTRAVENOUS | Status: AC | PRN
  Administered 2020-07-12: 32.3 via INTRAVENOUS

## 2020-07-12 MED ORDER — TECHNETIUM TC 99M TETROFOSMIN IV KIT
11.0000 | PACK | Freq: Once | INTRAVENOUS | Status: AC | PRN
Start: 2020-07-12 — End: 2020-07-12
  Administered 2020-07-12: 11 via INTRAVENOUS

## 2020-07-12 MED ORDER — REGADENOSON 0.4 MG/5ML IV SOLN
0.4000 mg | Freq: Once | INTRAVENOUS | Status: AC
  Administered 2020-07-12: 0.4 mg via INTRAVENOUS

## 2020-07-18 ENCOUNTER — Telehealth: Payer: Self-pay | Admitting: Cardiology

## 2020-07-18 NOTE — Telephone Encounter (Signed)
Called patient. She is worried about stress test result even though Dr. Agustin Cree said there were negative. She wants a appointment to go over them with him. Made her a appointment for this. No further questions.

## 2020-07-18 NOTE — Telephone Encounter (Signed)
New message:     Patient calling stating that she would like for some to call concering her results and would like to have a sooner apt. Would like to speak with [facility].

## 2020-07-21 DIAGNOSIS — L237 Allergic contact dermatitis due to plants, except food: Secondary | ICD-10-CM | POA: Diagnosis not present

## 2020-07-26 ENCOUNTER — Other Ambulatory Visit: Payer: Self-pay

## 2020-07-26 ENCOUNTER — Encounter: Payer: Self-pay | Admitting: Cardiology

## 2020-07-26 ENCOUNTER — Ambulatory Visit: Payer: PPO | Admitting: Cardiology

## 2020-07-26 VITALS — BP 142/80 | HR 64 | Wt 165.0 lb

## 2020-07-26 DIAGNOSIS — I341 Nonrheumatic mitral (valve) prolapse: Secondary | ICD-10-CM

## 2020-07-26 DIAGNOSIS — E782 Mixed hyperlipidemia: Secondary | ICD-10-CM

## 2020-07-26 DIAGNOSIS — R0600 Dyspnea, unspecified: Secondary | ICD-10-CM | POA: Diagnosis not present

## 2020-07-26 DIAGNOSIS — R0609 Other forms of dyspnea: Secondary | ICD-10-CM

## 2020-07-26 DIAGNOSIS — R5381 Other malaise: Secondary | ICD-10-CM

## 2020-07-26 DIAGNOSIS — R5383 Other fatigue: Secondary | ICD-10-CM | POA: Diagnosis not present

## 2020-07-26 NOTE — Patient Instructions (Signed)

## 2020-07-26 NOTE — Progress Notes (Signed)
Cardiology Office Note:    Date:  07/26/2020   ID:  ELYN KROGH, DOB [birth year], MRN [id number]  PCP:  Raina Mina., MD  Cardiologist:  Jenne Campus, MD    Referring MD: Raina Mina., MD   Chief Complaint  Patient presents with  . Shortness of Breath  . Follow-up  I am doing fine  History of Present Illness:    Deanna Walsh is a 74 y.o. female with past medical history significant mitral valve prolapse, however, last echocardiogram did not show any prolapse or regurgitation she does have minimally myxomatous valves but overall doing well, she did have history of calcification of the coronary arteries but recent stress test showed no evidence of ischemia, dyslipidemia.  She has been complaining of being weak tired and exhausted having some shortness of breath she describes duration when she for example have to clean cats litter and she will develop shortness of breath.  She tells me that she is always very energetic, she is on the go all the time.  She does not rest.  She did have a stress test showed no evidence of ischemia she would like to discuss those findings.  Past Medical History:  Diagnosis Date  . Cancer ([place])    Breast Ca  . Collagen vascular disease ([place])   . Personal history of radiation therapy     Past Surgical History:  Procedure Laterality Date  . BREAST LUMPECTOMY Left 1999  . CARPAL TUNNEL RELEASE    . CHOLECYSTECTOMY    . TONSILLECTOMY      Current Medications: Current Meds  Medication Sig  . Calcium Carb-Cholecalciferol (CALCIUM 1000 + D PO) Take by mouth daily.  Marland Kitchen latanoprost (XALATAN) 0.005 % ophthalmic solution 1 drop at bedtime.  . senna (SENOKOT) 8.6 MG tablet Take 1 tablet by mouth daily.  . [DISCONTINUED] aspirin-acetaminophen-caffeine (EXCEDRIN MIGRAINE) 250-250-65 MG tablet Take 1 tablet by mouth every 6 (six) hours as needed for headache.     Allergies:   Patient has no known allergies.   Social History   Socioeconomic  History  . Marital status: Married    Spouse name: Not on file  . Number of children: Not on file  . Years of education: Not on file  . Highest education level: Not on file  Occupational History  . Not on file  Tobacco Use  . Smoking status: Never Smoker  . Smokeless tobacco: Never Used  Vaping Use  . Vaping Use: Never used  Substance and Sexual Activity  . Alcohol use: Not Currently  . Drug use: Never  . Sexual activity: Not on file  Other Topics Concern  . Not on file  Social History Narrative  . Not on file   Social Determinants of Health   Financial Resource Strain:   . Difficulty of Paying Living Expenses: Not on file  Food Insecurity:   . Worried About Charity fundraiser in the Last Year: Not on file  . Ran Out of Food in the Last Year: Not on file  Transportation Needs:   . Lack of Transportation (Medical): Not on file  . Lack of Transportation (Non-Medical): Not on file  Physical Activity:   . Days of Exercise per Week: Not on file  . Minutes of Exercise per Session: Not on file  Stress:   . Feeling of Stress : Not on file  Social Connections:   . Frequency of Communication with Friends and Family: Not on file  . Frequency  of Social Gatherings with Friends and Family: Not on file  . Attends Religious Services: Not on file  . Active Member of Clubs or Organizations: Not on file  . Attends Archivist Meetings: Not on file  . Marital Status: Not on file     Family History: The patient's family history includes Cervical cancer in her mother; Diabetes in her father; Hypertension in her father. ROS:   Please see the history of present illness.    All 14 point review of systems negative except as described per history of present illness  EKGs/Labs/Other Studies Reviewed:      Recent Labs: No results found for requested labs within last 8760 hours.  Recent Lipid Panel No results found for: CHOL, TRIG, HDL, CHOLHDL, VLDL, LDLCALC,  LDLDIRECT  Physical Exam:    VS:  BP (!) 142/80 (BP Location: Right Arm, Patient Position: Sitting)   Pulse 64   Wt 165 lb (74.8 kg)   SpO2 98%   BMI 26.63 kg/m     Wt Readings from Last 3 Encounters:  07/26/20 165 lb (74.8 kg)  07/12/20 163 lb (73.9 kg)  03/10/20 163 lb 6.4 oz (74.1 kg)     GEN:  Well nourished, well developed in no acute distress HEENT: Normal NECK: No JVD; No carotid bruits LYMPHATICS: No lymphadenopathy CARDIAC: RRR, no murmurs, no rubs, no gallops RESPIRATORY:  Clear to auscultation without rales, wheezing or rhonchi  ABDOMEN: Soft, non-tender, non-distended MUSCULOSKELETAL:  No edema; No deformity  SKIN: Warm and dry LOWER EXTREMITIES: no swelling NEUROLOGIC:  Alert and oriented x 3 PSYCHIATRIC:  Normal affect   ASSESSMENT:    1. Dyspnea on exertion   2. Malaise and fatigue   3. Mitral valve prolapse   4. Mixed hyperlipidemia    PLAN:    In order of problems listed above:  1. Dyspnea on exertion so far no clear-cut reason identified, ejection fraction normal, no ischemia on stress test. 2. Malaise and fatigue multifactorial, that being the best thing by primary care physician. 3. History of mitral valve prolapse about latest echocardiogram did not show which which means that the mitral valve prolapse is not significant.  She does have mildly myxomatous valve but not enough to justify her symptoms 4.  5. Dyslipidemia do we did talk in length about potentially treating her dyslipidemia.  She is an intermediate risk group for 10 years events.  I offer her statin she prefers diet and exercises.  I did review her K PN which show me her LDL of 89 and HDL 48. 6. We did talk about the fact that she is very busy all the times that she cannot rest.  Even when she takes medication she always think about what is happening at home.  I we did talk about relaxation technique.  She does have free membership to the gym and I recommend her to go there and look for  some classes that will help her.    Medication Adjustments/Labs and Tests Ordered: Current medicines are reviewed at length with the patient today.  Concerns regarding medicines are outlined above.  No orders of the defined types were placed in this encounter.  Medication changes: No orders of the defined types were placed in this encounter.   Signed, Park Liter, MD, [facility] 07/26/2020 10:41 AM    [place]

## 2020-08-02 DIAGNOSIS — L237 Allergic contact dermatitis due to plants, except food: Secondary | ICD-10-CM | POA: Diagnosis not present

## 2020-08-03 DIAGNOSIS — L237 Allergic contact dermatitis due to plants, except food: Secondary | ICD-10-CM | POA: Diagnosis not present

## 2020-08-10 DIAGNOSIS — G4700 Insomnia, unspecified: Secondary | ICD-10-CM | POA: Diagnosis not present

## 2020-08-10 DIAGNOSIS — Z01419 Encounter for gynecological examination (general) (routine) without abnormal findings: Secondary | ICD-10-CM | POA: Diagnosis not present

## 2020-08-10 DIAGNOSIS — Z6827 Body mass index (BMI) 27.0-27.9, adult: Secondary | ICD-10-CM | POA: Diagnosis not present

## 2020-08-16 ENCOUNTER — Ambulatory Visit: Payer: PPO | Admitting: Cardiology

## 2020-08-16 DIAGNOSIS — M47896 Other spondylosis, lumbar region: Secondary | ICD-10-CM | POA: Diagnosis not present

## 2020-08-16 DIAGNOSIS — M9905 Segmental and somatic dysfunction of pelvic region: Secondary | ICD-10-CM | POA: Diagnosis not present

## 2020-08-16 DIAGNOSIS — M9901 Segmental and somatic dysfunction of cervical region: Secondary | ICD-10-CM | POA: Diagnosis not present

## 2020-08-16 DIAGNOSIS — M545 Low back pain, unspecified: Secondary | ICD-10-CM | POA: Diagnosis not present

## 2020-08-16 DIAGNOSIS — M9902 Segmental and somatic dysfunction of thoracic region: Secondary | ICD-10-CM | POA: Diagnosis not present

## 2020-08-16 DIAGNOSIS — M9903 Segmental and somatic dysfunction of lumbar region: Secondary | ICD-10-CM | POA: Diagnosis not present

## 2020-08-16 DIAGNOSIS — M542 Cervicalgia: Secondary | ICD-10-CM | POA: Diagnosis not present

## 2020-08-24 DIAGNOSIS — E041 Nontoxic single thyroid nodule: Secondary | ICD-10-CM | POA: Diagnosis not present

## 2020-08-30 DIAGNOSIS — H26493 Other secondary cataract, bilateral: Secondary | ICD-10-CM | POA: Diagnosis not present

## 2020-08-30 DIAGNOSIS — H353131 Nonexudative age-related macular degeneration, bilateral, early dry stage: Secondary | ICD-10-CM | POA: Diagnosis not present

## 2020-08-30 DIAGNOSIS — Z961 Presence of intraocular lens: Secondary | ICD-10-CM | POA: Diagnosis not present

## 2020-08-30 DIAGNOSIS — H43393 Other vitreous opacities, bilateral: Secondary | ICD-10-CM | POA: Diagnosis not present

## 2020-08-30 DIAGNOSIS — H401131 Primary open-angle glaucoma, bilateral, mild stage: Secondary | ICD-10-CM | POA: Diagnosis not present

## 2020-08-30 DIAGNOSIS — E041 Nontoxic single thyroid nodule: Secondary | ICD-10-CM | POA: Diagnosis not present

## 2020-11-22 DIAGNOSIS — Z961 Presence of intraocular lens: Secondary | ICD-10-CM | POA: Diagnosis not present

## 2020-11-22 DIAGNOSIS — H26493 Other secondary cataract, bilateral: Secondary | ICD-10-CM | POA: Diagnosis not present

## 2020-11-22 DIAGNOSIS — H353131 Nonexudative age-related macular degeneration, bilateral, early dry stage: Secondary | ICD-10-CM | POA: Diagnosis not present

## 2020-11-22 DIAGNOSIS — H1011 Acute atopic conjunctivitis, right eye: Secondary | ICD-10-CM | POA: Diagnosis not present

## 2020-11-22 DIAGNOSIS — H401131 Primary open-angle glaucoma, bilateral, mild stage: Secondary | ICD-10-CM | POA: Diagnosis not present

## 2020-11-22 DIAGNOSIS — H43393 Other vitreous opacities, bilateral: Secondary | ICD-10-CM | POA: Diagnosis not present

## 2020-12-06 DIAGNOSIS — L918 Other hypertrophic disorders of the skin: Secondary | ICD-10-CM | POA: Diagnosis not present

## 2020-12-06 DIAGNOSIS — L821 Other seborrheic keratosis: Secondary | ICD-10-CM | POA: Diagnosis not present

## 2020-12-06 DIAGNOSIS — L820 Inflamed seborrheic keratosis: Secondary | ICD-10-CM | POA: Diagnosis not present

## 2021-01-16 ENCOUNTER — Other Ambulatory Visit: Payer: Self-pay

## 2021-01-16 DIAGNOSIS — C801 Malignant (primary) neoplasm, unspecified: Secondary | ICD-10-CM | POA: Insufficient documentation

## 2021-01-16 DIAGNOSIS — Z923 Personal history of irradiation: Secondary | ICD-10-CM | POA: Insufficient documentation

## 2021-01-16 DIAGNOSIS — M359 Systemic involvement of connective tissue, unspecified: Secondary | ICD-10-CM | POA: Insufficient documentation

## 2021-01-17 DIAGNOSIS — E782 Mixed hyperlipidemia: Secondary | ICD-10-CM | POA: Diagnosis not present

## 2021-01-17 DIAGNOSIS — E041 Nontoxic single thyroid nodule: Secondary | ICD-10-CM | POA: Diagnosis not present

## 2021-01-17 DIAGNOSIS — M159 Polyosteoarthritis, unspecified: Secondary | ICD-10-CM | POA: Diagnosis not present

## 2021-01-17 DIAGNOSIS — R030 Elevated blood-pressure reading, without diagnosis of hypertension: Secondary | ICD-10-CM | POA: Diagnosis not present

## 2021-01-17 DIAGNOSIS — E559 Vitamin D deficiency, unspecified: Secondary | ICD-10-CM | POA: Diagnosis not present

## 2021-01-17 DIAGNOSIS — H402234 Chronic angle-closure glaucoma, bilateral, indeterminate stage: Secondary | ICD-10-CM | POA: Diagnosis not present

## 2021-01-17 DIAGNOSIS — M8580 Other specified disorders of bone density and structure, unspecified site: Secondary | ICD-10-CM | POA: Diagnosis not present

## 2021-01-17 DIAGNOSIS — J449 Chronic obstructive pulmonary disease, unspecified: Secondary | ICD-10-CM | POA: Diagnosis not present

## 2021-01-17 DIAGNOSIS — Z853 Personal history of malignant neoplasm of breast: Secondary | ICD-10-CM | POA: Diagnosis not present

## 2021-01-17 DIAGNOSIS — I341 Nonrheumatic mitral (valve) prolapse: Secondary | ICD-10-CM | POA: Diagnosis not present

## 2021-01-17 DIAGNOSIS — K5909 Other constipation: Secondary | ICD-10-CM | POA: Diagnosis not present

## 2021-01-17 DIAGNOSIS — Z0000 Encounter for general adult medical examination without abnormal findings: Secondary | ICD-10-CM | POA: Diagnosis not present

## 2021-01-18 DIAGNOSIS — E559 Vitamin D deficiency, unspecified: Secondary | ICD-10-CM | POA: Diagnosis not present

## 2021-01-18 DIAGNOSIS — Z973 Presence of spectacles and contact lenses: Secondary | ICD-10-CM | POA: Diagnosis not present

## 2021-01-18 DIAGNOSIS — M159 Polyosteoarthritis, unspecified: Secondary | ICD-10-CM | POA: Diagnosis not present

## 2021-01-18 DIAGNOSIS — Z0000 Encounter for general adult medical examination without abnormal findings: Secondary | ICD-10-CM | POA: Diagnosis not present

## 2021-01-18 DIAGNOSIS — Z8262 Family history of osteoporosis: Secondary | ICD-10-CM | POA: Diagnosis not present

## 2021-01-18 DIAGNOSIS — R5381 Other malaise: Secondary | ICD-10-CM | POA: Diagnosis not present

## 2021-01-18 DIAGNOSIS — R5383 Other fatigue: Secondary | ICD-10-CM | POA: Diagnosis not present

## 2021-01-18 DIAGNOSIS — K5909 Other constipation: Secondary | ICD-10-CM | POA: Diagnosis not present

## 2021-01-18 DIAGNOSIS — Z853 Personal history of malignant neoplasm of breast: Secondary | ICD-10-CM | POA: Diagnosis not present

## 2021-01-18 DIAGNOSIS — E041 Nontoxic single thyroid nodule: Secondary | ICD-10-CM | POA: Diagnosis not present

## 2021-01-18 DIAGNOSIS — Z79899 Other long term (current) drug therapy: Secondary | ICD-10-CM | POA: Diagnosis not present

## 2021-01-18 DIAGNOSIS — H402234 Chronic angle-closure glaucoma, bilateral, indeterminate stage: Secondary | ICD-10-CM | POA: Diagnosis not present

## 2021-01-18 DIAGNOSIS — J449 Chronic obstructive pulmonary disease, unspecified: Secondary | ICD-10-CM | POA: Diagnosis not present

## 2021-01-18 DIAGNOSIS — M8580 Other specified disorders of bone density and structure, unspecified site: Secondary | ICD-10-CM | POA: Diagnosis not present

## 2021-01-18 DIAGNOSIS — E782 Mixed hyperlipidemia: Secondary | ICD-10-CM | POA: Diagnosis not present

## 2021-01-18 DIAGNOSIS — Z86010 Personal history of colonic polyps: Secondary | ICD-10-CM | POA: Diagnosis not present

## 2021-01-18 DIAGNOSIS — I341 Nonrheumatic mitral (valve) prolapse: Secondary | ICD-10-CM | POA: Diagnosis not present

## 2021-01-18 DIAGNOSIS — Z809 Family history of malignant neoplasm, unspecified: Secondary | ICD-10-CM | POA: Diagnosis not present

## 2021-01-18 DIAGNOSIS — R030 Elevated blood-pressure reading, without diagnosis of hypertension: Secondary | ICD-10-CM | POA: Diagnosis not present

## 2021-01-19 ENCOUNTER — Encounter: Payer: Self-pay | Admitting: Cardiology

## 2021-01-19 ENCOUNTER — Other Ambulatory Visit: Payer: Self-pay

## 2021-01-19 ENCOUNTER — Ambulatory Visit: Payer: PPO | Admitting: Cardiology

## 2021-01-19 VITALS — BP 136/72 | HR 76 | Ht 66.5 in | Wt 163.0 lb

## 2021-01-19 DIAGNOSIS — E782 Mixed hyperlipidemia: Secondary | ICD-10-CM

## 2021-01-19 DIAGNOSIS — I341 Nonrheumatic mitral (valve) prolapse: Secondary | ICD-10-CM

## 2021-01-19 DIAGNOSIS — E785 Hyperlipidemia, unspecified: Secondary | ICD-10-CM

## 2021-01-19 DIAGNOSIS — R5383 Other fatigue: Secondary | ICD-10-CM

## 2021-01-19 DIAGNOSIS — R5381 Other malaise: Secondary | ICD-10-CM | POA: Diagnosis not present

## 2021-01-19 MED ORDER — PRAVASTATIN SODIUM 20 MG PO TABS: 20.0000 mg | ORAL_TABLET | Freq: Every evening | ORAL | 1 refills | Status: DC

## 2021-01-19 NOTE — Addendum Note (Signed)
Addended by: Senaida Ores on: 01/19/2021 10:09 AM   Modules accepted: Orders

## 2021-01-19 NOTE — Progress Notes (Signed)
Cardiology Office Note:    Date:  01/19/2021   ID:  Deanna Walsh, DOB 11/24/1945, MRN [id number]  PCP:  Raina Mina., MD  Cardiologist:  Jenne Campus, MD    Referring MD: Raina Mina., MD   Chief Complaint  Patient presents with  . Follow-up    History of Present Illness:    Deanna Walsh is a 75 y.o. female with past medical history significant for mitral valve prolapse, however latest echocardiogram did not show any significant problems there was no regurgitation valve was minimally myxomatous but overall was doing good.  She has a history of calcification of the coronary artery had a stress test done showed no evidence of ischemia.  She comes today to my office to discuss her issues.  Overall she is doing well.  She is busy taking care of some sick But denies have any chest pain tightness squeezing pressure burning chest still very active does a lot of things.  Past Medical History:  Diagnosis Date  . Abnormal CXR 10/06/2018   Formatting of this note might be different from the original. She had abnormal CXR that showed COPD but patient does not think she does though she has had second hand smoke exposure, discussed with her recommendation for PFTs she wants to wait on this  . Cancer ([place])    Breast Ca  . Chronic constipation 11/30/2019  . Collagen vascular disease ([place])   . COPD suggested by initial evaluation ([place]) 06/22/2019  . Dyslipidemia 05/08/2016  . Dyspnea on exertion 03/10/2020  . Elevated blood-pressure reading without diagnosis of hypertension 10/06/2018  . History of breast cancer 12/31/2019  . Malaise and fatigue 01/20/2016   Last Assessment & Plan:  Formatting of this note might be different from the original. Relevant Hx: Course: Daily Update: Today's Plan:update her labs for her and discussed with her energy levels and her sleep which is stable overall  Electronically signed by: Mayer Camel, NP 01/23/16 [phone]  . Mitral valve prolapse  01/20/2016   Last Assessment & Plan:  Relevant Hx: Course: Daily Update: Today's Plan:she follows with Dr. Agustin Cree for her MVP and her [place] was done about year ago and it was stable for her  Electronically signed by: Mayer Camel, NP 01/23/16 [phone]  . Mixed hyperlipidemia 01/20/2016   Last Assessment & Plan:  Relevant Hx: Course: Daily Update: Today's Plan:she is fasting for this and has been trying to watch her diet  Electronically signed by: Mayer Camel, NP 01/23/16 [phone]  . Osteopenia 01/20/2016   Last Assessment & Plan:  Formatting of this note might be different from the original. Relevant Hx: Course: Daily Update: Today's Plan:she is taking Bone Strength for her bones and she still follows with her GYN for her womens health and it was this year and she was advised fosamax but refuses, Dr. Corinna Capra is her provider.  Electronically signed by: Mayer Camel, NP 01/23/16 0831  . Personal history of radiation therapy   . Primary osteoarthritis involving multiple joints 01/23/2016   Last Assessment & Plan:  Formatting of this note might be different from the original. Relevant Hx: Course: Daily Update: Today's Plan:she uses the ibuprofen and is careful with this and would like to have the prescription strength version of this as is easier.  Electronically signed by: Mayer Camel, NP 01/23/16 [phone]  . Thyroid nodule 07/01/2019    Past Surgical History:  Procedure Laterality Date  . BREAST LUMPECTOMY Left 1999  .  CARPAL TUNNEL RELEASE    . CHOLECYSTECTOMY    . TONSILLECTOMY      Current Medications: Current Meds  Medication Sig  . Aspirin-Acetaminophen-Caffeine (EXCEDRIN PO) Take 1 tablet by mouth as needed (Headaches).  Marland Kitchen BLACK COHOSH EXTRACT PO Take 1 tablet by mouth daily. Unknown strength  . Calcium Carb-Cholecalciferol (CALCIUM 1000 + D PO) Take 1 tablet by mouth daily.  Marland Kitchen latanoprost (XALATAN) 0.005 % ophthalmic solution Place 1 drop into  both eyes at bedtime.  . Methylsulfonylmethane (MSM PO) Take 1,000 mg by mouth in the morning and at bedtime.  . Multiple Vitamins-Minerals (PRESERVISION AREDS 2 PO) Take 1 tablet by mouth at bedtime.  . senna (SENOKOT) 8.6 MG tablet Take 1 tablet by mouth at bedtime.     Allergies:   Patient has no known allergies.   Social History   Socioeconomic History  . Marital status: Married    Spouse name: Not on file  . Number of children: Not on file  . Years of education: Not on file  . Highest education level: Not on file  Occupational History  . Not on file  Tobacco Use  . Smoking status: Never Smoker  . Smokeless tobacco: Never Used  Vaping Use  . Vaping Use: Never used  Substance and Sexual Activity  . Alcohol use: Not Currently  . Drug use: Never  . Sexual activity: Not on file  Other Topics Concern  . Not on file  Social History Narrative  . Not on file   Social Determinants of Health   Financial Resource Strain: Not on file  Food Insecurity: Not on file  Transportation Needs: Not on file  Physical Activity: Not on file  Stress: Not on file  Social Connections: Not on file     Family History: The patient's family history includes Cervical cancer in her mother; Diabetes in her father; Hypertension in her father. ROS:   Please see the history of present illness.    All 14 point review of systems negative except as described per history of present illness  EKGs/Labs/Other Studies Reviewed:      Recent Labs: No results found for requested labs within last 8760 hours.  Recent Lipid Panel No results found for: CHOL, TRIG, HDL, CHOLHDL, VLDL, LDLCALC, LDLDIRECT  Physical Exam:    VS:  BP 136/72 (BP Location: Right Arm, Patient Position: Sitting)   Pulse 76   Ht 5' 6.5" (1.689 m)   Wt 163 lb (73.9 kg)   SpO2 93%   BMI 25.91 kg/m     Wt Readings from Last 3 Encounters:  01/19/21 163 lb (73.9 kg)  07/26/20 165 lb (74.8 kg)  07/12/20 163 lb (73.9 kg)      GEN:  Well nourished, well developed in no acute distress HEENT: Normal NECK: No JVD; No carotid bruits LYMPHATICS: No lymphadenopathy CARDIAC: RRR, no murmurs, no rubs, no gallops RESPIRATORY:  Clear to auscultation without rales, wheezing or rhonchi  ABDOMEN: Soft, non-tender, non-distended MUSCULOSKELETAL:  No edema; No deformity  SKIN: Warm and dry LOWER EXTREMITIES: no swelling NEUROLOGIC:  Alert and oriented x 3 PSYCHIATRIC:  Normal affect   ASSESSMENT:    1. Malaise and fatigue   2. Mixed hyperlipidemia   3. Mitral valve prolapse   4. Dyslipidemia    PLAN:    In order of problems listed above:  1. Malaise and fatigue that is improving.  She did not talk anything to me about this today. 2. Dyslipidemia: I did review her  fasting lipid profile done by primary care physician yesterday showing LDL 122 HDL 67.  She is at intermediate risk on top of that she does have calcification of the coronary artery therefore the indication to start with statin.  I talked to her about what she wanted to get over. 3. History of mitral valve prolapse no significant mitral regurgitation no prolapse on the latest echocardiogram noted. 4. Coronary artery disease in form of calcifications.  But asymptomatic, stress test negative.  The key is risk factors modifications.  She is already on aspirin I will talk about starting statin.   Medication Adjustments/Labs and Tests Ordered: Current medicines are reviewed at length with the patient today.  Concerns regarding medicines are outlined above.  No orders of the defined types were placed in this encounter.  Medication changes: No orders of the defined types were placed in this encounter.   Signed, Park Liter, MD, [facility] 01/19/2021 9:59 AM    [place]

## 2021-01-19 NOTE — Patient Instructions (Signed)
Medication Instructions:  Your physician has recommended you make the following change in your medication:   START: Pravastatin 20 mg daily  *If you need a refill on your cardiac medications before your next appointment, please call your pharmacy*   Lab Work: Your physician recommends that you return for lab work in 6 [place]: lipid, lft  If you have labs (blood work) drawn today and your tests are completely normal, you will receive your results only by: Marland Kitchen MyChart Message (if you have MyChart) OR . A paper copy in the mail If you have any lab test that is abnormal or we need to change your treatment, we will call you to review the results.   Testing/Procedures: None   Follow-Up: At [facility], you and your health needs are our priority.  As part of our continuing mission to provide you with exceptional heart care, we have created designated Provider Care Teams.  These Care Teams include your primary Cardiologist (physician) and Advanced Practice Providers (APPs -  Physician Assistants and Nurse Practitioners) who all work together to provide you with the care you need, when you need it.  We recommend signing up for the patient portal called "MyChart".  Sign up information is provided on this After Visit Summary.  MyChart is used to connect with patients for Virtual Visits (Telemedicine).  Patients are able to view lab/test results, encounter notes, upcoming appointments, etc.  Non-urgent messages can be sent to your provider as well.   To learn more about what you can do with MyChart, go to [email or web address].    Your next appointment:   6 month(s)  The format for your next appointment:   In Person  Provider:   Jenne Campus, MD   Other Instructions  Pravastatin Tablets What is this medicine? PRAVASTATIN (PRA va stat in) is known as a HMG-CoA reductase inhibitor or 'statin'. It lowers the level of cholesterol and triglycerides in the blood. This drug may also reduce  the risk of heart attack, stroke, or other health problems in patients with risk factors for heart disease. Diet and lifestyle changes are often used with this drug. This medicine may be used for other purposes; ask your health care provider or pharmacist if you have questions. COMMON BRAND NAME(S): Pravachol What should I tell my health care provider before I take this medicine? They need to know if you have any of these conditions:  diabetes  if you often drink alcohol  history of stroke  kidney disease  liver disease  muscle aches or weakness  thyroid disease  an unusual or allergic reaction to pravastatin, other medicines, foods, dyes, or preservatives  pregnant or trying to get pregnant  breast-feeding How should I use this medicine? Take pravastatin tablets by mouth. Swallow the tablets with a drink of water. Pravastatin can be taken at anytime of the day, with or without food. Follow the directions on the prescription label. Take your doses at regular intervals. Do not take your medicine more often than directed. Talk to your pediatrician regarding the use of this medicine in children. Special care may be needed. Pravastatin has been used in children as young as 74 years of age. Overdosage: If you think you have taken too much of this medicine contact a poison control center or emergency room at once. NOTE: This medicine is only for you. Do not share this medicine with others. What if I miss a dose? If you miss a dose, take it as soon as you can.  If it is almost time for your next dose, take only that dose. Do not take double or extra doses. What may interact with this medicine? This medicine may interact with the following medications:  colchicine  cyclosporine  other medicines for high cholesterol  some antibiotics like azithromycin, clarithromycin, erythromycin, and telithromycin This list may not describe all possible interactions. Give your health care provider a  list of all the medicines, herbs, non-prescription drugs, or dietary supplements you use. Also tell them if you smoke, drink alcohol, or use illegal drugs. Some items may interact with your medicine. What should I watch for while using this medicine? Visit your doctor or health care professional for regular check-ups. You may need regular tests to make sure your liver is working properly. Your health care professional may tell you to stop taking this medicine if you develop muscle problems. If your muscle problems do not go away after stopping this medicine, contact your health care professional. Do not become pregnant while taking this medicine. Women should inform their health care professional if they wish to become pregnant or think they might be pregnant. There is a potential for serious side effects to an unborn child. Talk to your health care professional or pharmacist for more information. Do not breast-feed an infant while taking this medicine. This medicine may affect blood sugar levels. If you have diabetes, check with your doctor or health care professional before you change your diet or the dose of your diabetic medicine. If you are going to need surgery or other procedure, tell your doctor that you are using this medicine. This drug is only part of a total heart-health program. Your doctor or a dietician can suggest a low-cholesterol and low-fat diet to help. Avoid alcohol and smoking, and keep a proper exercise schedule. This medicine may cause a decrease in Co-Enzyme Q-10. You should make sure that you get enough Co-Enzyme Q-10 while you are taking this medicine. Discuss the foods you eat and the vitamins you take with your health care professional. What side effects may I notice from receiving this medicine? Side effects that you should report to your doctor or health care professional as soon as possible:  allergic reactions like skin rash, itching or hives, swelling of the face, lips,  or tongue  dark urine  fever  muscle pain, cramps, or weakness  redness, blistering, peeling or loosening of the skin, including inside the mouth  trouble passing urine or change in the amount of urine  unusually weak or tired  yellowing of the eyes or skin Side effects that usually do not require medical attention (report to your doctor or health care professional if they continue or are bothersome):  gas  headache  heartburn  indigestion  stomach pain This list may not describe all possible side effects. Call your doctor for medical advice about side effects. You may report side effects to FDA at 1-800-FDA-1088. Where should I keep my medicine? Keep out of the reach of children. Store at room temperature between 15 to 30 degrees C (59 to 86 degrees F). Protect from light. Keep container tightly closed. Throw away any unused medicine after the expiration date. NOTE: This sheet is a summary. It may not cover all possible information. If you have questions about this medicine, talk to your doctor, pharmacist, or health care provider.  2021 Elsevier/Gold Standard (2020-07-10 09:58:03)

## 2021-02-20 DIAGNOSIS — M9905 Segmental and somatic dysfunction of pelvic region: Secondary | ICD-10-CM | POA: Diagnosis not present

## 2021-02-20 DIAGNOSIS — M47896 Other spondylosis, lumbar region: Secondary | ICD-10-CM | POA: Diagnosis not present

## 2021-02-20 DIAGNOSIS — M9903 Segmental and somatic dysfunction of lumbar region: Secondary | ICD-10-CM | POA: Diagnosis not present

## 2021-02-20 DIAGNOSIS — M9902 Segmental and somatic dysfunction of thoracic region: Secondary | ICD-10-CM | POA: Diagnosis not present

## 2021-02-20 DIAGNOSIS — M9901 Segmental and somatic dysfunction of cervical region: Secondary | ICD-10-CM | POA: Diagnosis not present

## 2021-02-20 DIAGNOSIS — M461 Sacroiliitis, not elsewhere classified: Secondary | ICD-10-CM | POA: Diagnosis not present

## 2021-03-02 DIAGNOSIS — M9903 Segmental and somatic dysfunction of lumbar region: Secondary | ICD-10-CM | POA: Diagnosis not present

## 2021-03-02 DIAGNOSIS — M9901 Segmental and somatic dysfunction of cervical region: Secondary | ICD-10-CM | POA: Diagnosis not present

## 2021-03-02 DIAGNOSIS — M461 Sacroiliitis, not elsewhere classified: Secondary | ICD-10-CM | POA: Diagnosis not present

## 2021-03-02 DIAGNOSIS — M47896 Other spondylosis, lumbar region: Secondary | ICD-10-CM | POA: Diagnosis not present

## 2021-03-02 DIAGNOSIS — M9902 Segmental and somatic dysfunction of thoracic region: Secondary | ICD-10-CM | POA: Diagnosis not present

## 2021-03-02 DIAGNOSIS — M9905 Segmental and somatic dysfunction of pelvic region: Secondary | ICD-10-CM | POA: Diagnosis not present

## 2021-03-21 IMAGING — MG DIGITAL SCREENING BILAT W/ TOMO W/ CAD
8 series · 9 of 24 positions shown · non-contrast
Comparison: Previous exam(s).

CLINICAL DATA: Screening.

EXAM:
DIGITAL SCREENING BILATERAL MAMMOGRAM WITH TOMO AND CAD

[R MLO synth-2D]
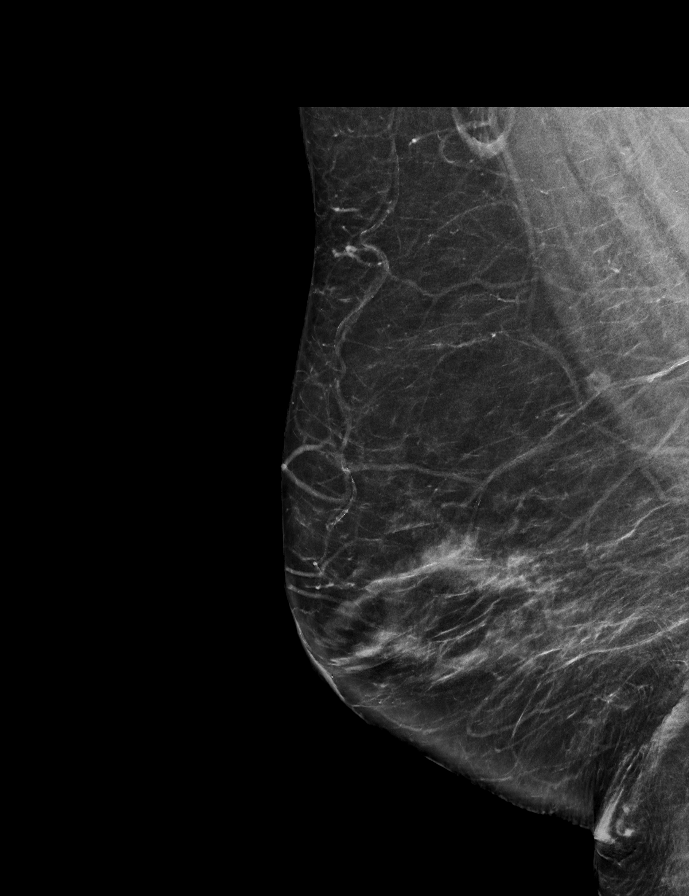

[L MLO synth-2D]
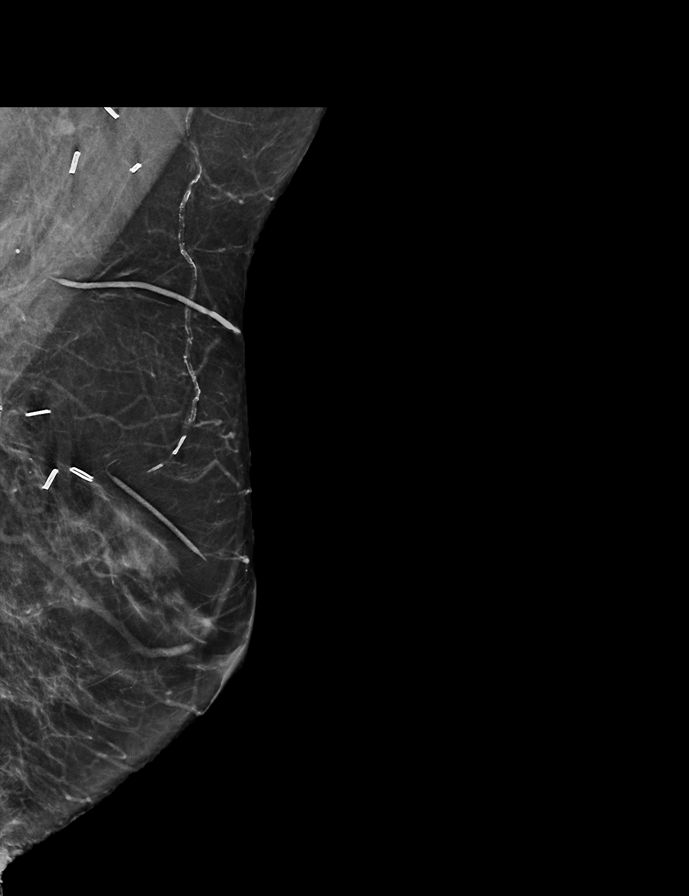

[R CC synth-2D]
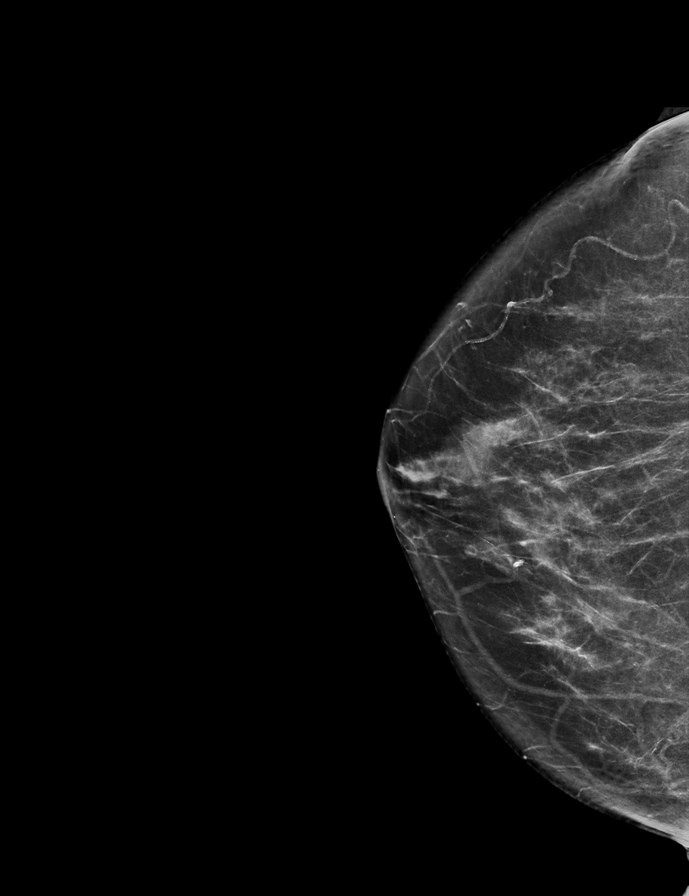

[L CC synth-2D]
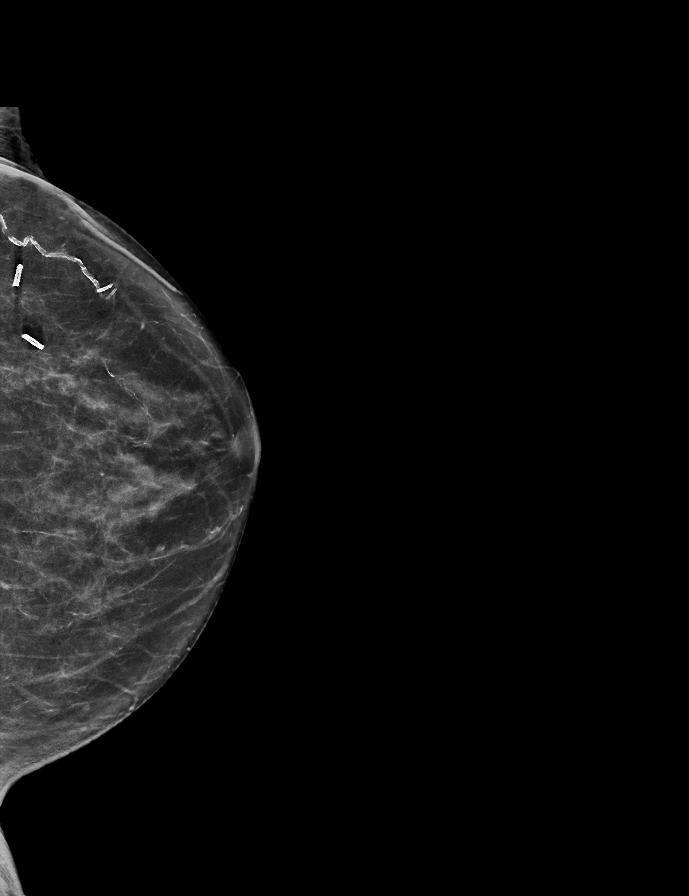

[R CC tomo · 2 of 66 frames shown]
[frame 22/66]
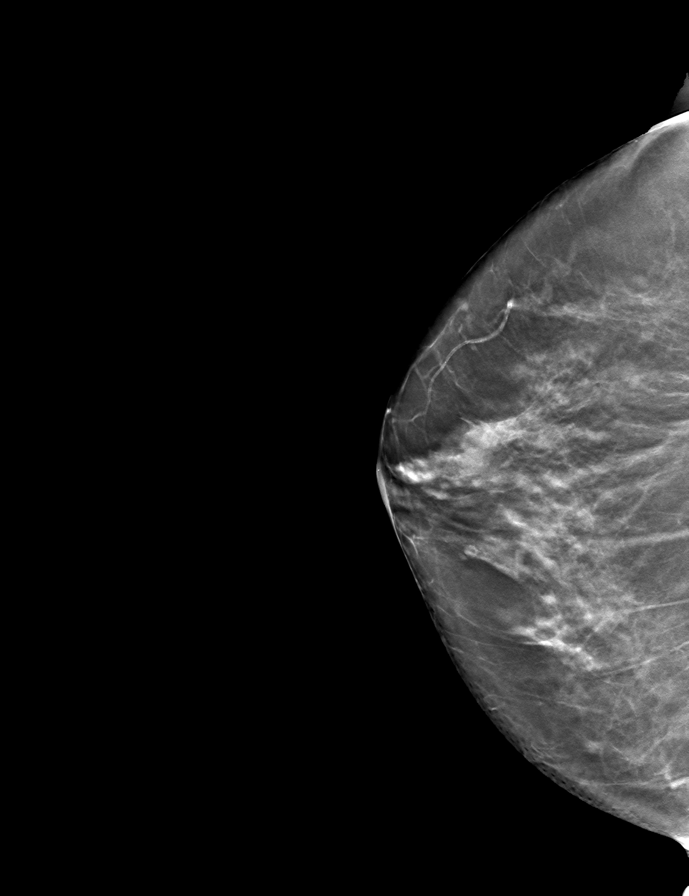
[frame 33/66]
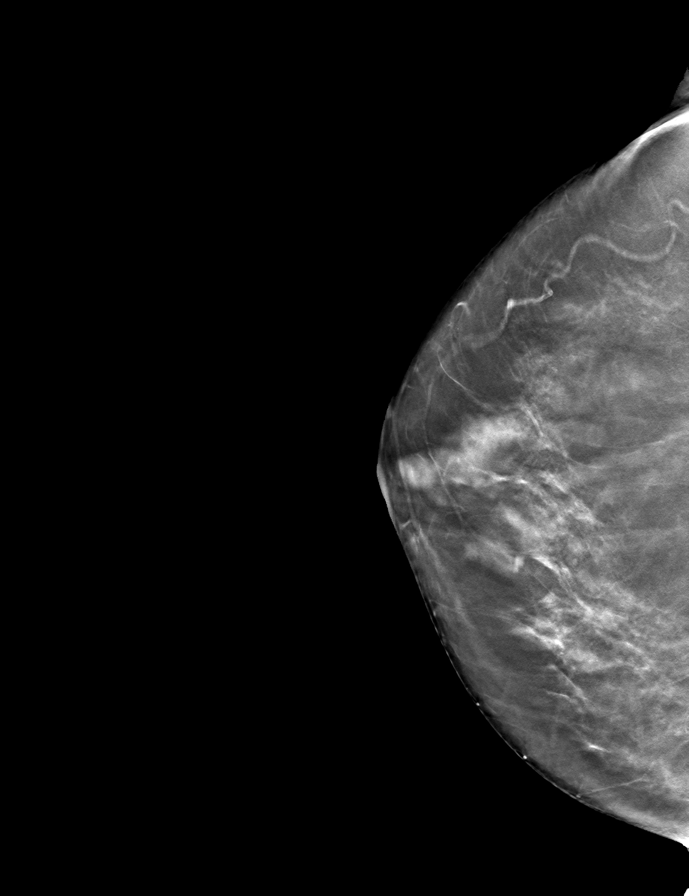

[R MLO tomo · tomo slice 35/68.0]
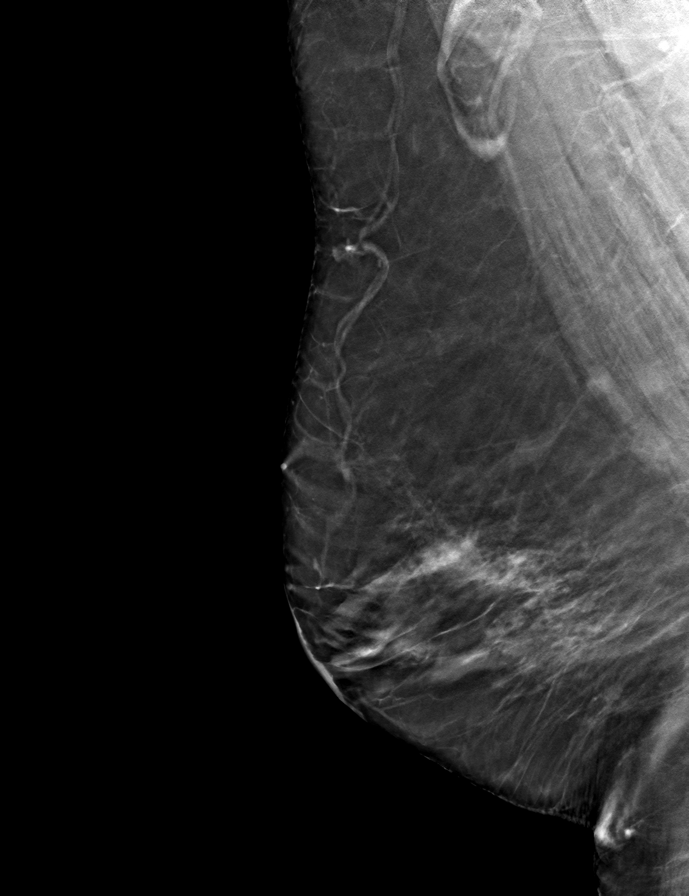

[L CC tomo · tomo slice 31/60.0]
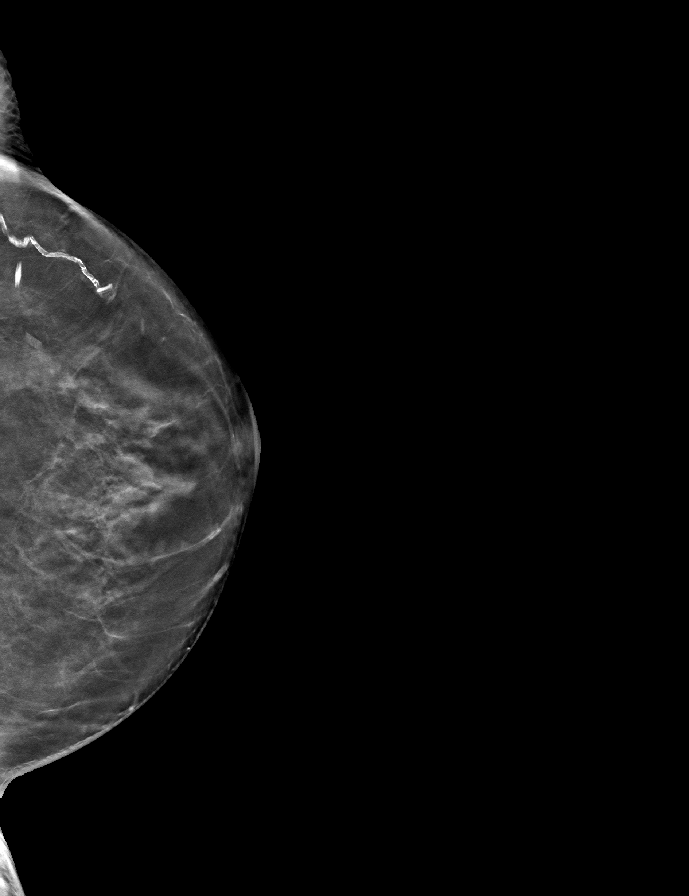

[L MLO tomo · tomo slice 30/59.0]
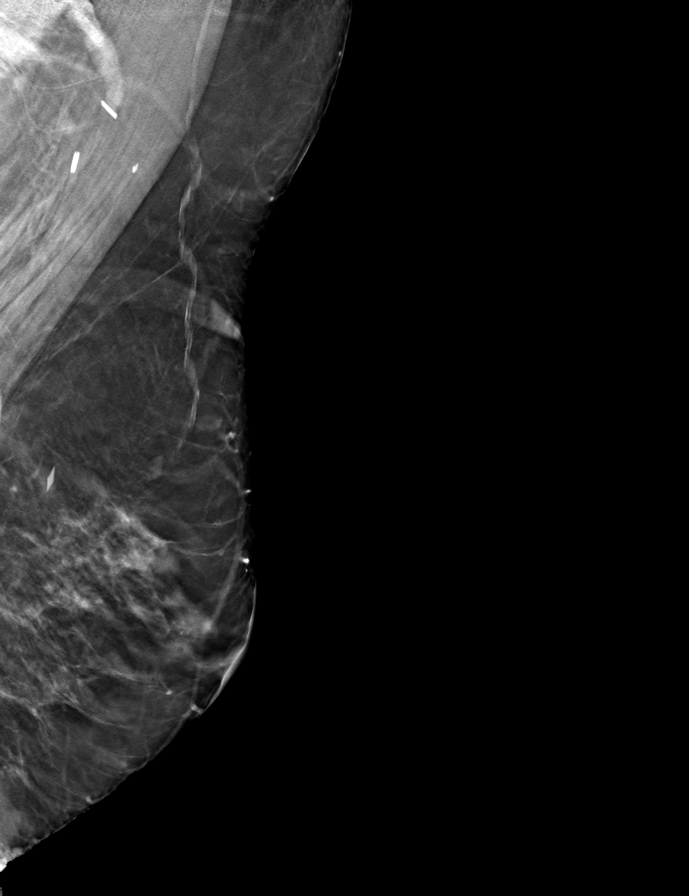

[9 of 24 positions shown; findings below may reference images not displayed]

ACR Breast Density Category c: The breast tissue is heterogeneously
dense, which may obscure small masses.
FINDINGS: There are no findings suspicious for malignancy. Images were
processed with CAD.
IMPRESSION: No mammographic evidence of malignancy. A result letter of this
screening mammogram will be mailed directly to the patient.

RECOMMENDATION:
Screening mammogram in one year. (Code:[id number])

BI-RADS CATEGORY  1: Negative.

## 2021-03-23 ENCOUNTER — Other Ambulatory Visit: Payer: Self-pay | Admitting: Obstetrics and Gynecology

## 2021-03-23 DIAGNOSIS — Z1231 Encounter for screening mammogram for malignant neoplasm of breast: Secondary | ICD-10-CM

## 2021-04-03 DIAGNOSIS — R509 Fever, unspecified: Secondary | ICD-10-CM | POA: Diagnosis not present

## 2021-04-03 DIAGNOSIS — J40 Bronchitis, not specified as acute or chronic: Secondary | ICD-10-CM | POA: Diagnosis not present

## 2021-04-03 DIAGNOSIS — U071 COVID-19: Secondary | ICD-10-CM | POA: Diagnosis not present

## 2021-04-11 ENCOUNTER — Ambulatory Visit: Payer: PPO

## 2021-04-13 DIAGNOSIS — M545 Low back pain, unspecified: Secondary | ICD-10-CM | POA: Diagnosis not present

## 2021-04-13 DIAGNOSIS — M546 Pain in thoracic spine: Secondary | ICD-10-CM | POA: Diagnosis not present

## 2021-04-13 DIAGNOSIS — R5381 Other malaise: Secondary | ICD-10-CM | POA: Diagnosis not present

## 2021-04-13 DIAGNOSIS — Z8616 Personal history of COVID-19: Secondary | ICD-10-CM | POA: Insufficient documentation

## 2021-04-13 DIAGNOSIS — R0981 Nasal congestion: Secondary | ICD-10-CM | POA: Diagnosis not present

## 2021-04-13 DIAGNOSIS — R5383 Other fatigue: Secondary | ICD-10-CM | POA: Diagnosis not present

## 2021-05-04 ENCOUNTER — Telehealth: Payer: Self-pay | Admitting: Cardiology

## 2021-05-04 NOTE — Telephone Encounter (Signed)
Pt c/o of Chest Pain: STAT if CP now or developed within 24 hours  1. Are you having CP right now? a little Heaviness in her chest- not all the time- concerned- feeling like this  in the last week  2. Are you experiencing any other symptoms (ex. SOB, nausea, vomiting, sweating)? no  3. How long have you been experiencing CP?  In the last week  4. Is your CP continuous or coming and going?  Comes and goes  5. Have you taken Nitroglycerin? No- I made an appointment for 07-05-21- please call to evaluate ?

## 2021-05-04 NOTE — Telephone Encounter (Signed)
Per Dr. Bettina Gavia recommends force in next week with Dr. Raliegh Ip. Patient worked in on Monday 08/29 at 1pm.

## 2021-05-08 ENCOUNTER — Encounter: Payer: Self-pay | Admitting: Cardiology

## 2021-05-08 ENCOUNTER — Other Ambulatory Visit: Payer: Self-pay

## 2021-05-08 ENCOUNTER — Ambulatory Visit: Payer: PPO | Admitting: Cardiology

## 2021-05-08 VITALS — BP 124/66 | HR 80 | Ht 66.5 in | Wt 162.2 lb

## 2021-05-08 DIAGNOSIS — E785 Hyperlipidemia, unspecified: Secondary | ICD-10-CM | POA: Diagnosis not present

## 2021-05-08 DIAGNOSIS — R0609 Other forms of dyspnea: Secondary | ICD-10-CM

## 2021-05-08 DIAGNOSIS — R0600 Dyspnea, unspecified: Secondary | ICD-10-CM

## 2021-05-08 DIAGNOSIS — I341 Nonrheumatic mitral (valve) prolapse: Secondary | ICD-10-CM | POA: Diagnosis not present

## 2021-05-08 NOTE — Patient Instructions (Signed)
Medication Instructions:  Your physician recommends that you continue on your current medications as directed. Please refer to the Current Medication list given to you today.  *If you need a refill on your cardiac medications before your next appointment, please call your pharmacy*   Lab Work: Your physician recommends that you return for lab work in: [place] Please come fasting If you have labs (blood work) drawn today and your tests are completely normal, you will receive your results only by: [place] (if you have MyChart) OR A paper copy in the mail If you have any lab test that is abnormal or we need to change your treatment, we will call you to review the results.   Testing/Procedures: None   Follow-Up: At [facility], you and your health needs are our priority.  As part of our continuing mission to provide you with exceptional heart care, we have created designated Provider Care Teams.  These Care Teams include your primary Cardiologist (physician) and Advanced Practice Providers (APPs -  Physician Assistants and Nurse Practitioners) who all work together to provide you with the care you need, when you need it.  We recommend signing up for the patient portal called "MyChart".  Sign up information is provided on this After Visit Summary.  MyChart is used to connect with patients for Virtual Visits (Telemedicine).  Patients are able to view lab/test results, encounter notes, upcoming appointments, etc.  Non-urgent messages can be sent to your provider as well.   To learn more about what you can do with MyChart, go to [email or web address].    Your next appointment:   6 month(s)  The format for your next appointment:   In Person  Provider:   Jenne Campus, MD   Other Instructions ]

## 2021-05-08 NOTE — Progress Notes (Signed)
Cardiology Office Note:    Date:  05/08/2021   ID:  HANNY COLTRIN, DOB 05-26-1946, MRN [id number]  PCP:  Raina Mina., MD  Cardiologist:  Jenne Campus, MD    Referring MD: Raina Mina., MD   No chief complaint on file. Doing well  History of Present Illness:    Deanna Walsh is a 75 y.o. female with past medical history significant for mitral valve prolapse, however latest echocardiogram did not show any significant problem there was mild mitral regurgitation, history of calcification of the coronary however stress test showed no evidence of ischemia.  She comes today to my office to talk about these issues.  He was recently she also suffer from COVID-19 infection.  She had a relatively mild course and recovered from it but since that time she complained of having pain she pinpoint to the location above the left breast where pain is she said this is lasting all day she wakes up with this during the day she does not feel that much because she is busy and then goes to sleep with that for at least last week.  May be slightly getting better.  Past Medical History:  Diagnosis Date   Abnormal CXR 10/06/2018   Formatting of this note might be different from the original. She had abnormal CXR that showed COPD but patient does not think she does though she has had second hand smoke exposure, discussed with her recommendation for PFTs she wants to wait on this   Cancer [facility])    Breast Ca   Chronic constipation 11/30/2019   Collagen vascular disease ([place])    COPD suggested by initial evaluation ([place]) 06/22/2019   Dyslipidemia 05/08/2016   Dyspnea on exertion 03/10/2020   Elevated blood-pressure reading without diagnosis of hypertension 10/06/2018   History of breast cancer 12/31/2019   Malaise and fatigue 01/20/2016   Last Assessment & Plan:  Formatting of this note might be different from the original. Relevant Hx: Course: Daily Update: Today's Plan:update her labs for her and discussed  with her energy levels and her sleep which is stable overall  Electronically signed by: Mayer Camel, NP 01/23/16 [phone]   Mitral valve prolapse 01/20/2016   Last Assessment & Plan:  Relevant Hx: Course: Daily Update: Today's Plan:she follows with Dr. Agustin Cree for her MVP and her [place] was done about year ago and it was stable for her  Electronically signed by: Mayer Camel, NP 01/23/16 0835   Mixed hyperlipidemia 01/20/2016   Last Assessment & Plan:  Relevant Hx: Course: Daily Update: Today's Plan:she is fasting for this and has been trying to watch her diet  Electronically signed by: Mayer Camel, NP 01/23/16 0832   Osteopenia 01/20/2016   Last Assessment & Plan:  Formatting of this note might be different from the original. Relevant Hx: Course: Daily Update: Today's Plan:she is taking Bone Strength for her bones and she still follows with her GYN for her womens health and it was this year and she was advised fosamax but refuses, Dr. Corinna Capra is her provider.  Electronically signed by: Mayer Camel, NP 01/23/16 0831   Personal history of radiation therapy    Primary osteoarthritis involving multiple joints 01/23/2016   Last Assessment & Plan:  Formatting of this note might be different from the original. Relevant Hx: Course: Daily Update: Today's Plan:she uses the ibuprofen and is careful with this and would like to have the prescription strength version of this as is  easier.  Electronically signed by: Mayer Camel, NP 01/23/16 [phone]   Thyroid nodule 07/01/2019    Past Surgical History:  Procedure Laterality Date   BREAST LUMPECTOMY Left 1999   CARPAL TUNNEL RELEASE     CHOLECYSTECTOMY     TONSILLECTOMY      Current Medications: No outpatient medications have been marked as taking for the 05/08/21 encounter (Appointment) with Park Liter, MD.     Allergies:   Patient has no known allergies.   Social History   Socioeconomic  History   Marital status: Married    Spouse name: Not on file   Number of children: Not on file   Years of education: Not on file   Highest education level: Not on file  Occupational History   Not on file  Tobacco Use   Smoking status: Never   Smokeless tobacco: Never  Vaping Use   Vaping Use: Never used  Substance and Sexual Activity   Alcohol use: Not Currently   Drug use: Never   Sexual activity: Not on file  Other Topics Concern   Not on file  Social History Narrative   Not on file   Social Determinants of Health   Financial Resource Strain: Not on file  Food Insecurity: Not on file  Transportation Needs: Not on file  Physical Activity: Not on file  Stress: Not on file  Social Connections: Not on file     Family History: The patient's family history includes Cervical cancer in her mother; Diabetes in her father; Hypertension in her father. ROS:   Please see the history of present illness.    All 14 point review of systems negative except as described per history of present illness  EKGs/Labs/Other Studies Reviewed:      Recent Labs: No results found for requested labs within last 8760 hours.  Recent Lipid Panel No results found for: CHOL, TRIG, HDL, CHOLHDL, VLDL, LDLCALC, LDLDIRECT  Physical Exam:    VS:  There were no vitals taken for this visit.    Wt Readings from Last 3 Encounters:  01/19/21 163 lb (73.9 kg)  07/26/20 165 lb (74.8 kg)  07/12/20 163 lb (73.9 kg)     GEN:  Well nourished, well developed in no acute distress HEENT: Normal NECK: No JVD; No carotid bruits LYMPHATICS: No lymphadenopathy CARDIAC: RRR, no murmurs, no rubs, no gallops RESPIRATORY:  Clear to auscultation without rales, wheezing or rhonchi  ABDOMEN: Soft, non-tender, non-distended MUSCULOSKELETAL:  No edema; No deformity  SKIN: Warm and dry LOWER EXTREMITIES: no swelling NEUROLOGIC:  Alert and oriented x 3 PSYCHIATRIC:  Normal affect   ASSESSMENT:    1. Mitral  valve prolapse   2. Dyslipidemia   3. Dyspnea on exertion    PLAN:    In order of problems listed above:  Atypical chest pain.  Really does not look like cardiac pinpoint location lasting all day no aggravation or relieving factors no shortness of breath no sweating associated with this sensation.  It probably still recovering from [place].  I asked her to let me know if pain get worse or change characteristic until then we will continue monitoring. Mitral valve prolapse last echocardiogram did not show that.  We will continue monitoring. Dyslipidemia she admits that she did not take pravastatin when I gave her last time.  I asked her to stop taking pravastatin 6 [place] from now we will check her fasting blood profile [place] exertion now after COVID we will continue monitoring.  Medication Adjustments/Labs and Tests Ordered: Current medicines are reviewed at length with the patient today.  Concerns regarding medicines are outlined above.  No orders of the defined types were placed in this encounter.  Medication changes: No orders of the defined types were placed in this encounter.   Signed, Park Liter, MD, [facility] 05/08/2021 1:03 PM    [place]

## 2021-06-13 DIAGNOSIS — N958 Other specified menopausal and perimenopausal disorders: Secondary | ICD-10-CM | POA: Diagnosis not present

## 2021-06-13 DIAGNOSIS — M8588 Other specified disorders of bone density and structure, other site: Secondary | ICD-10-CM | POA: Diagnosis not present

## 2021-06-13 DIAGNOSIS — R29890 Loss of height: Secondary | ICD-10-CM | POA: Diagnosis not present

## 2021-06-13 DIAGNOSIS — C50912 Malignant neoplasm of unspecified site of left female breast: Secondary | ICD-10-CM | POA: Diagnosis not present

## 2021-06-15 ENCOUNTER — Ambulatory Visit: Payer: PPO

## 2021-06-19 DIAGNOSIS — R5383 Other fatigue: Secondary | ICD-10-CM | POA: Diagnosis not present

## 2021-06-19 DIAGNOSIS — R5381 Other malaise: Secondary | ICD-10-CM | POA: Diagnosis not present

## 2021-06-19 DIAGNOSIS — E782 Mixed hyperlipidemia: Secondary | ICD-10-CM | POA: Diagnosis not present

## 2021-06-19 DIAGNOSIS — E785 Hyperlipidemia, unspecified: Secondary | ICD-10-CM | POA: Diagnosis not present

## 2021-06-19 DIAGNOSIS — I341 Nonrheumatic mitral (valve) prolapse: Secondary | ICD-10-CM | POA: Diagnosis not present

## 2021-06-19 LAB — LIPID PANEL
Chol/HDL Ratio: 2.4 ratio (ref 0.0–4.4)
Cholesterol, Total: 186 mg/dL (ref 100–199)
HDL: 77 mg/dL (ref >39)
LDL Chol Calc (NIH): 99 mg/dL (ref 0–99)
Triglycerides: 54 mg/dL (ref 0–149)
VLDL Cholesterol Cal: 10 mg/dL (ref 5–40)

## 2021-06-19 LAB — HEPATIC FUNCTION PANEL
ALT: 11 IU/L (ref 0–32)
AST: 20 IU/L (ref 0–40)
Albumin: 4.3 g/dL (ref 3.7–4.7)
Alkaline Phosphatase: 90 IU/L (ref 44–121)
Bilirubin Total: 0.5 mg/dL (ref 0.0–1.2)
Bilirubin, Direct: 0.12 mg/dL (ref 0.00–0.40)
Total Protein: 6.4 g/dL (ref 6.0–8.5)

## 2021-06-20 ENCOUNTER — Ambulatory Visit
Admission: RE | Admit: 2021-06-20 | Discharge: 2021-06-20 | Disposition: A | Discharge status: Home / Self Care | Payer: PPO | Source: Ambulatory Visit | Attending: Obstetrics and Gynecology | Admitting: Obstetrics and Gynecology

## 2021-06-20 ENCOUNTER — Other Ambulatory Visit: Payer: Self-pay

## 2021-06-20 ENCOUNTER — Telehealth: Payer: Self-pay | Admitting: Emergency Medicine

## 2021-06-20 DIAGNOSIS — E782 Mixed hyperlipidemia: Secondary | ICD-10-CM

## 2021-06-20 DIAGNOSIS — E785 Hyperlipidemia, unspecified: Secondary | ICD-10-CM

## 2021-06-20 DIAGNOSIS — Z1231 Encounter for screening mammogram for malignant neoplasm of breast: Secondary | ICD-10-CM | POA: Diagnosis not present

## 2021-06-20 NOTE — Telephone Encounter (Signed)
Called patient. Informed her of results. She reports she has already been taking pravastatin for 6 week. 20 mg once a day. This was not noted in her chart.   Will check with Dr. Agustin Cree to see how he wants to proceed.

## 2021-06-20 NOTE — Telephone Encounter (Signed)
-----   Message from Park Liter, MD sent at 06/19/2021  9:40 PM EDT ----- Cholesterol mildly elevated but with calcification of the coronary artery I prefer her to be on cholesterol medication I proposed start Lipitor 10 mg daily, fasting lipid profile, AST ALT need to be checked in 6 Deanna Walsh

## 2021-06-30 NOTE — Telephone Encounter (Signed)
Spoke to patient. She is following up. Will check with Dr. Agustin Cree.

## 2021-07-04 NOTE — Telephone Encounter (Signed)
Deanna Walsh is calling back in regards to her appt due to not hearing back. She has canceled this appointment at this time, but would like a callback to know if something else needs to be scheduled.

## 2021-07-05 ENCOUNTER — Ambulatory Visit: Payer: PPO | Admitting: Cardiology

## 2021-07-05 MED ORDER — PRAVASTATIN SODIUM 40 MG PO TABS: 40.0000 mg | ORAL_TABLET | Freq: Every evening | ORAL | 1 refills | Status: DC

## 2021-07-05 NOTE — Telephone Encounter (Signed)
Per Dr. Agustin Cree stated: Faythe Ghee, I think I understand now.  Please double the dose of pravastatin.  She is to have fasting lipid profile, AST ALT done in 6 [place]    Patient notified of recommendation. Agreed with plan. Rx sent, lab order on file. Patient aware to be fasting.

## 2021-07-05 NOTE — Addendum Note (Signed)
Addended by: Darrel Reach on: 07/05/2021 05:09 PM   Modules accepted: Orders

## 2021-07-20 DIAGNOSIS — Z8616 Personal history of COVID-19: Secondary | ICD-10-CM | POA: Diagnosis not present

## 2021-07-20 DIAGNOSIS — J449 Chronic obstructive pulmonary disease, unspecified: Secondary | ICD-10-CM | POA: Diagnosis not present

## 2021-07-20 DIAGNOSIS — H402234 Chronic angle-closure glaucoma, bilateral, indeterminate stage: Secondary | ICD-10-CM | POA: Diagnosis not present

## 2021-07-20 DIAGNOSIS — K5909 Other constipation: Secondary | ICD-10-CM | POA: Diagnosis not present

## 2021-07-20 DIAGNOSIS — I341 Nonrheumatic mitral (valve) prolapse: Secondary | ICD-10-CM | POA: Diagnosis not present

## 2021-07-20 DIAGNOSIS — R5383 Other fatigue: Secondary | ICD-10-CM | POA: Diagnosis not present

## 2021-07-20 DIAGNOSIS — Z853 Personal history of malignant neoplasm of breast: Secondary | ICD-10-CM | POA: Diagnosis not present

## 2021-07-20 DIAGNOSIS — R030 Elevated blood-pressure reading, without diagnosis of hypertension: Secondary | ICD-10-CM | POA: Diagnosis not present

## 2021-07-20 DIAGNOSIS — Z0000 Encounter for general adult medical examination without abnormal findings: Secondary | ICD-10-CM | POA: Diagnosis not present

## 2021-07-20 DIAGNOSIS — M159 Polyosteoarthritis, unspecified: Secondary | ICD-10-CM | POA: Diagnosis not present

## 2021-07-20 DIAGNOSIS — E041 Nontoxic single thyroid nodule: Secondary | ICD-10-CM | POA: Diagnosis not present

## 2021-07-20 DIAGNOSIS — M8580 Other specified disorders of bone density and structure, unspecified site: Secondary | ICD-10-CM | POA: Diagnosis not present

## 2021-07-20 DIAGNOSIS — R7989 Other specified abnormal findings of blood chemistry: Secondary | ICD-10-CM | POA: Diagnosis not present

## 2021-07-20 DIAGNOSIS — R5381 Other malaise: Secondary | ICD-10-CM | POA: Diagnosis not present

## 2021-07-20 DIAGNOSIS — Z79899 Other long term (current) drug therapy: Secondary | ICD-10-CM | POA: Diagnosis not present

## 2021-07-20 DIAGNOSIS — E782 Mixed hyperlipidemia: Secondary | ICD-10-CM | POA: Diagnosis not present

## 2021-08-08 DIAGNOSIS — M47896 Other spondylosis, lumbar region: Secondary | ICD-10-CM | POA: Diagnosis not present

## 2021-08-08 DIAGNOSIS — M461 Sacroiliitis, not elsewhere classified: Secondary | ICD-10-CM | POA: Diagnosis not present

## 2021-08-08 DIAGNOSIS — M9902 Segmental and somatic dysfunction of thoracic region: Secondary | ICD-10-CM | POA: Diagnosis not present

## 2021-08-08 DIAGNOSIS — M9901 Segmental and somatic dysfunction of cervical region: Secondary | ICD-10-CM | POA: Diagnosis not present

## 2021-08-08 DIAGNOSIS — M9905 Segmental and somatic dysfunction of pelvic region: Secondary | ICD-10-CM | POA: Diagnosis not present

## 2021-08-08 DIAGNOSIS — M9903 Segmental and somatic dysfunction of lumbar region: Secondary | ICD-10-CM | POA: Diagnosis not present

## 2021-08-17 ENCOUNTER — Other Ambulatory Visit: Payer: Self-pay | Admitting: *Deleted

## 2021-08-17 DIAGNOSIS — E782 Mixed hyperlipidemia: Secondary | ICD-10-CM

## 2021-08-17 DIAGNOSIS — E785 Hyperlipidemia, unspecified: Secondary | ICD-10-CM | POA: Diagnosis not present

## 2021-08-18 LAB — LIPID PANEL
Chol/HDL Ratio: 2.3 ratio (ref 0.0–4.4)
Cholesterol, Total: 174 mg/dL (ref 100–199)
HDL: 76 mg/dL (ref >39)
LDL Chol Calc (NIH): 88 mg/dL (ref 0–99)
Triglycerides: 52 mg/dL (ref 0–149)
VLDL Cholesterol Cal: 10 mg/dL (ref 5–40)

## 2021-08-18 LAB — AST: AST: 24 IU/L (ref 0–40)

## 2021-08-18 LAB — ALT: ALT: 13 IU/L (ref 0–32)

## 2021-08-21 DIAGNOSIS — Z01419 Encounter for gynecological examination (general) (routine) without abnormal findings: Secondary | ICD-10-CM | POA: Diagnosis not present

## 2021-08-21 DIAGNOSIS — Z6826 Body mass index (BMI) 26.0-26.9, adult: Secondary | ICD-10-CM | POA: Diagnosis not present

## 2021-08-21 DIAGNOSIS — R102 Pelvic and perineal pain: Secondary | ICD-10-CM | POA: Diagnosis not present

## 2021-08-21 DIAGNOSIS — Z853 Personal history of malignant neoplasm of breast: Secondary | ICD-10-CM | POA: Diagnosis not present

## 2021-08-22 ENCOUNTER — Telehealth: Payer: Self-pay | Admitting: Cardiology

## 2021-08-22 NOTE — Telephone Encounter (Signed)
Follow Up:      Patient was returning Hayley's call from this morning, concerning her lab results.

## 2021-08-22 NOTE — Telephone Encounter (Signed)
Patient informed of results.  

## 2021-08-30 DIAGNOSIS — R140 Abdominal distension (gaseous): Secondary | ICD-10-CM | POA: Diagnosis not present

## 2021-08-30 DIAGNOSIS — D259 Leiomyoma of uterus, unspecified: Secondary | ICD-10-CM | POA: Diagnosis not present

## 2021-08-30 DIAGNOSIS — R102 Pelvic and perineal pain: Secondary | ICD-10-CM | POA: Diagnosis not present

## 2021-10-24 ENCOUNTER — Telehealth: Payer: Self-pay | Admitting: Cardiology

## 2021-10-24 NOTE — Telephone Encounter (Signed)
Follow Up:      Patient is returning call from 08-22-21, concerning her lab results.

## 2021-10-24 NOTE — Telephone Encounter (Signed)
Patient called about obtaining copies of her labs from 08/17/21. Labs were reviewed with the patient and copies were mailed to her house per her request.

## 2021-11-07 ENCOUNTER — Ambulatory Visit: Payer: PPO | Admitting: Cardiology

## 2021-11-07 ENCOUNTER — Encounter: Payer: Self-pay | Admitting: Cardiology

## 2021-11-07 ENCOUNTER — Other Ambulatory Visit: Payer: Self-pay

## 2021-11-07 VITALS — BP 156/80 | HR 74 | Ht 65.0 in | Wt 162.8 lb

## 2021-11-07 DIAGNOSIS — E785 Hyperlipidemia, unspecified: Secondary | ICD-10-CM | POA: Diagnosis not present

## 2021-11-07 DIAGNOSIS — R5381 Other malaise: Secondary | ICD-10-CM | POA: Diagnosis not present

## 2021-11-07 DIAGNOSIS — R0609 Other forms of dyspnea: Secondary | ICD-10-CM | POA: Diagnosis not present

## 2021-11-07 DIAGNOSIS — I341 Nonrheumatic mitral (valve) prolapse: Secondary | ICD-10-CM | POA: Diagnosis not present

## 2021-11-07 DIAGNOSIS — R5383 Other fatigue: Secondary | ICD-10-CM

## 2021-11-07 NOTE — Addendum Note (Signed)
Addended by: Edwyna Shell I on: 11/07/2021 12:13 PM   Modules accepted: Orders

## 2021-11-07 NOTE — Progress Notes (Signed)
Cardiology Office Note:    Date:  11/07/2021   ID:  Deanna Walsh, Deanna Walsh 1946/03/31, MRN [id number]  PCP:  Raina Mina., MD  Cardiologist:  Jenne Campus, MD    Referring MD: Raina Mina., MD   Chief Complaint  Patient presents with   Fatigue    History of Present Illness:    Deanna Walsh is a 76 y.o. female with past medical history significant for mitral valve prolapse with mild mitral valve regurgitation, essential hypertension, dyslipidemia.  She also had history of atypical chest pain and stress test was done in November 2021 showing no significant ischemia.  She also got carotic ultrasound done which showed no critical lesions. He is coming today to my office for follow-up overall she complains being weak tired exhausted.  She did have COVID few months ago since that time she had difficulty recovering.  Denies have any chest pain tightness squeezing pressure burning chest she does have some shortness of breath she is wondering if this may be related to her heart.  She also noted to have some epistaxis.  And she thinks may be heart related which I told her this is highly unlikely.  She is scheduled to see ENT specialist.  Past Medical History:  Diagnosis Date   Abnormal CXR 10/06/2018   Formatting of this note might be different from the original. She had abnormal CXR that showed COPD but patient does not think she does though she has had second hand smoke exposure, discussed with her recommendation for PFTs she wants to wait on this   Cancer [facility])    Breast Ca   Chronic constipation 11/30/2019   Collagen vascular disease ([place])    COPD suggested by initial evaluation ([place]) 06/22/2019   Dyslipidemia 05/08/2016   Dyspnea on exertion 03/10/2020   Elevated blood-pressure reading without diagnosis of hypertension 10/06/2018   History of breast cancer 12/31/2019   Malaise and fatigue 01/20/2016   Last Assessment & Plan:  Formatting of this note might be different from the  original. Relevant Hx: Course: Daily Update: Today's Plan:update her labs for her and discussed with her energy levels and her sleep which is stable overall  Electronically signed by: Mayer Camel, NP 01/23/16 [phone]   Mitral valve prolapse 01/20/2016   Last Assessment & Plan:  Relevant Hx: Course: Daily Update: Today's Plan:she follows with Dr. Agustin Cree for her MVP and her [place] was done about year ago and it was stable for her  Electronically signed by: Mayer Camel, NP 01/23/16 0835   Mixed hyperlipidemia 01/20/2016   Last Assessment & Plan:  Relevant Hx: Course: Daily Update: Today's Plan:she is fasting for this and has been trying to watch her diet  Electronically signed by: Mayer Camel, NP 01/23/16 0832   Osteopenia 01/20/2016   Last Assessment & Plan:  Formatting of this note might be different from the original. Relevant Hx: Course: Daily Update: Today's Plan:she is taking Bone Strength for her bones and she still follows with her GYN for her womens health and it was this year and she was advised fosamax but refuses, Dr. Corinna Capra is her provider.  Electronically signed by: Mayer Camel, NP 01/23/16 0831   Personal history of radiation therapy    Primary osteoarthritis involving multiple joints 01/23/2016   Last Assessment & Plan:  Formatting of this note might be different from the original. Relevant Hx: Course: Daily Update: Today's Plan:she uses the ibuprofen and is careful with this and  would like to have the prescription strength version of this as is easier.  Electronically signed by: Mayer Camel, NP 01/23/16 [phone]   Thyroid nodule 07/01/2019    Past Surgical History:  Procedure Laterality Date   BREAST LUMPECTOMY Left 1999   CARPAL TUNNEL RELEASE     CHOLECYSTECTOMY     TONSILLECTOMY      Current Medications: Current Meds  Medication Sig   Aspirin-Acetaminophen-Caffeine (EXCEDRIN PO) Take 200 mg by mouth as needed  (Headaches).   BLACK COHOSH EXTRACT PO Take 100 mg by mouth daily.   calcium citrate (CALCITRATE - DOSED IN MG ELEMENTAL CALCIUM) 950 (200 Ca) MG tablet Take 200 mg of elemental calcium by mouth 2 (two) times daily.   latanoprost (XALATAN) 0.005 % ophthalmic solution Place 1 drop into both eyes at bedtime.   Methylsulfonylmethane (MSM PO) Take 1,000 mg by mouth at bedtime.   OVER THE COUNTER MEDICATION Take 1,000 mg by mouth daily. 1000-400mg  [place] Based calcium and citrate   pravastatin (PRAVACHOL) 40 MG tablet Take 1 tablet (40 mg total) by mouth every evening.   senna (SENOKOT) 8.6 MG tablet Take 1 tablet by mouth at bedtime.     Allergies:   Patient has no known allergies.   Social History   Socioeconomic History   Marital status: Married    Spouse name: Not on file   Number of children: Not on file   Years of education: Not on file   Highest education level: Not on file  Occupational History   Not on file  Tobacco Use   Smoking status: Never   Smokeless tobacco: Never  Vaping Use   Vaping Use: Never used  Substance and Sexual Activity   Alcohol use: Not Currently   Drug use: Never   Sexual activity: Not on file  Other Topics Concern   Not on file  Social History Narrative   Not on file   Social Determinants of Health   Financial Resource Strain: Not on file  Food Insecurity: Not on file  Transportation Needs: Not on file  Physical Activity: Not on file  Stress: Not on file  Social Connections: Not on file     Family History: The patient's family history includes Cervical cancer in her mother; Diabetes in her father; Hypertension in her father. ROS:   Please see the history of present illness.    All 14 point review of systems negative except as described per history of present illness  EKGs/Labs/Other Studies Reviewed:      Recent Labs: 08/17/2021: ALT 13  Recent Lipid Panel    Component Value Date/Time   CHOL 174 08/17/2021 0923   TRIG 52 08/17/2021  0923   HDL 76 08/17/2021 0923   CHOLHDL 2.3 08/17/2021 0923   LDLCALC 88 08/17/2021 0923    Physical Exam:    VS:  BP (!) 156/80 (BP Location: Left Arm, Patient Position: Sitting)    Pulse 74    Ht 5\' 5"  (1.651 m)    Wt 162 lb 12.8 oz (73.8 kg)    SpO2 95%    BMI 27.09 kg/m     Wt Readings from Last 3 Encounters:  11/07/21 162 lb 12.8 oz (73.8 kg)  05/08/21 162 lb 3.2 oz (73.6 kg)  01/19/21 163 lb (73.9 kg)     GEN:  Well nourished, well developed in no acute distress HEENT: Normal NECK: No JVD; No carotid bruits LYMPHATICS: No lymphadenopathy CARDIAC: RRR, midsystolic click with soft systolic murmur grade  1/6 best heard at apex no rubs, no gallops RESPIRATORY:  Clear to auscultation without rales, wheezing or rhonchi  ABDOMEN: Soft, non-tender, non-distended MUSCULOSKELETAL:  No edema; No deformity  SKIN: Warm and dry LOWER EXTREMITIES: no swelling NEUROLOGIC:  Alert and oriented x 3 PSYCHIATRIC:  Normal affect   ASSESSMENT:    1. Mitral valve prolapse   2. Malaise and fatigue   3. Dyspnea on exertion   4. Dyslipidemia    PLAN:    In order of problems listed above:  Mitral valve prolapse.  We will recheck her echocardiogram.  On physical exam I hear very soft click and soft murmur I do not anticipate this to be critical. Malaise and fatigue multifactorial.  Still having difficulty recovering from COVID.  Echocardiogram will be done to check left ventricle ejection fraction. Dyspnea exertion multifactorial again echocardiogram will be done stress test on year ago showed no evidence of ischemia. Dyslipidemia she is taking pravastatin 40 which I will continue.  Her HDL 76 LDL 88 this is from August 17, 2021.  Good cholesterol profile continue present management   Medication Adjustments/Labs and Tests Ordered: Current medicines are reviewed at length with the patient today.  Concerns regarding medicines are outlined above.  No orders of the defined types were placed in  this encounter.  Medication changes: No orders of the defined types were placed in this encounter.   Signed, Park Liter, MD, [facility] 11/07/2021 12:00 PM    [place]

## 2021-11-07 NOTE — Patient Instructions (Addendum)
Medication Instructions:  Your physician recommends that you continue on your current medications as directed. Please refer to the Current Medication list given to you today.  *If you need a refill on your cardiac medications before your next appointment, please call your pharmacy*   Lab Work: None If you have labs (blood work) drawn today and your tests are completely normal, you will receive your results only by: [place] (if you have MyChart) OR A paper copy in the mail If you have any lab test that is abnormal or we need to change your treatment, we will call you to review the results.   Testing/Procedures: Your physician has requested that you have an echocardiogram. Echocardiography is a painless test that uses sound waves to create images of your heart. It provides your doctor with information about the size and shape of your heart and how well your hearts chambers and valves are working. This procedure takes approximately one hour. There are no restrictions for this procedure.    Follow-Up: At [facility], you and your health needs are our priority.  As part of our continuing mission to provide you with exceptional heart care, we have created designated Provider Care Teams.  These Care Teams include your primary Cardiologist (physician) and Advanced Practice Providers (APPs -  Physician Assistants and Nurse Practitioners) who all work together to provide you with the care you need, when you need it.  We recommend signing up for the patient portal called "MyChart".  Sign up information is provided on this After Visit Summary.  MyChart is used to connect with patients for Virtual Visits (Telemedicine).  Patients are able to view lab/test results, encounter notes, upcoming appointments, etc.  Non-urgent messages can be sent to your provider as well.   To learn more about what you can do with MyChart, go to [email or web address].    Your next appointment:   6 month(s)  The  format for your next appointment:   In Person  Provider:   Jenne Campus, MD    Other Instructions 24 Hour Blood Pressure Monitor ordered

## 2021-11-10 ENCOUNTER — Ambulatory Visit (INDEPENDENT_AMBULATORY_CARE_PROVIDER_SITE_OTHER): Payer: PPO

## 2021-11-10 ENCOUNTER — Other Ambulatory Visit: Payer: Self-pay

## 2021-11-10 DIAGNOSIS — R5383 Other fatigue: Secondary | ICD-10-CM

## 2021-11-10 DIAGNOSIS — I341 Nonrheumatic mitral (valve) prolapse: Secondary | ICD-10-CM | POA: Diagnosis not present

## 2021-11-10 DIAGNOSIS — E785 Hyperlipidemia, unspecified: Secondary | ICD-10-CM | POA: Diagnosis not present

## 2021-11-10 DIAGNOSIS — R5381 Other malaise: Secondary | ICD-10-CM | POA: Diagnosis not present

## 2021-11-10 DIAGNOSIS — R0609 Other forms of dyspnea: Secondary | ICD-10-CM

## 2021-11-10 LAB — ECHOCARDIOGRAM COMPLETE
Area-P 1/2: 3.31 cm2
Calc EF: 54.4 %
MV M vel: 5.63 m/s
MV Peak grad: 126.8 mmHg
S' Lateral: 2.5 cm
Single Plane A2C EF: 55.7 %
Single Plane A4C EF: 54.4 %

## 2021-11-14 ENCOUNTER — Telehealth: Payer: Self-pay

## 2021-11-14 NOTE — Telephone Encounter (Signed)
I have called and spoke with patient, she states that she has been seeing a cardiologist for some things that she's had going on and she will call us back and schedule when she gets that taken care of. ?

## 2021-11-20 ENCOUNTER — Telehealth: Payer: Self-pay | Admitting: Cardiology

## 2021-11-20 ENCOUNTER — Other Ambulatory Visit: Payer: Self-pay

## 2021-11-20 ENCOUNTER — Ambulatory Visit (INDEPENDENT_AMBULATORY_CARE_PROVIDER_SITE_OTHER): Payer: PPO

## 2021-11-20 DIAGNOSIS — R0609 Other forms of dyspnea: Secondary | ICD-10-CM | POA: Diagnosis not present

## 2021-11-20 DIAGNOSIS — R5383 Other fatigue: Secondary | ICD-10-CM

## 2021-11-20 DIAGNOSIS — E785 Hyperlipidemia, unspecified: Secondary | ICD-10-CM

## 2021-11-20 DIAGNOSIS — I341 Nonrheumatic mitral (valve) prolapse: Secondary | ICD-10-CM | POA: Diagnosis not present

## 2021-11-20 DIAGNOSIS — R5381 Other malaise: Secondary | ICD-10-CM | POA: Diagnosis not present

## 2021-11-20 NOTE — Telephone Encounter (Signed)
Patient is calling to get more information about what to do for her appt this morning about wearing a blood pressure monitor. Please call back to discuss ?

## 2021-11-20 NOTE — Telephone Encounter (Signed)
Spoke with pt and let her know there are no special instructions for the 24 HR BP Monitor ?

## 2021-11-27 ENCOUNTER — Encounter: Payer: Self-pay | Admitting: Gastroenterology

## 2021-12-04 ENCOUNTER — Telehealth: Payer: Self-pay | Admitting: Gastroenterology

## 2021-12-04 ENCOUNTER — Telehealth: Payer: Self-pay | Admitting: Cardiology

## 2021-12-04 NOTE — Telephone Encounter (Signed)
Called patient and explained that the results should be back in a couple of days. Patient asked that we mail a copy of the results to her and she had no further questions at this time. ?

## 2021-12-04 NOTE — Telephone Encounter (Signed)
Inbound call from patient stating that she is scheduled to have a colon procedure on 5/31 and she has now starting to have issues with bloating. Patient is wanting to schedule an appointment to see Dr. Lyndel Safe before her procedure to see if he thinks everything is okay. I advised patient that Dr. Steve Rattler next available is 5/10 and that we would need to cancel the procedure. Patient stated that she wanted to speak to the nurse because she had been waiting for months to be scheduled for her procedure and does not want to cancel. Please advise.  ?

## 2021-12-04 NOTE — Telephone Encounter (Signed)
Pt asking that nurse call's with BP monitor results ?

## 2021-12-05 NOTE — Telephone Encounter (Signed)
Pt stated that she has been having bloating that is getting worse over the last several months: Pt requesting an office visit: ?Pt was scheduled to see Dr Lyndel Safe on 01/17/2022 at 10:00 in [place]: Pt made aware: address provided: ?Pt verbalized understanding with all questions answered.  ? ?

## 2021-12-07 ENCOUNTER — Other Ambulatory Visit: Payer: Self-pay

## 2021-12-07 MED ORDER — AMLODIPINE BESYLATE 5 MG PO TABS: 5.0000 mg | ORAL_TABLET | Freq: Every day | ORAL | 3 refills | Status: DC

## 2021-12-07 NOTE — Telephone Encounter (Signed)
Called patient and informed her of the results of her 24 hour blood pressure monitor and ordered blood pressure medication as recommended by Dr. Agustin Cree. Patient was agreeable with this plan and had no further questions at this time. A copy of the test result was mailed to the patient per her request. ?

## 2022-01-12 DIAGNOSIS — I10 Essential (primary) hypertension: Secondary | ICD-10-CM

## 2022-01-12 HISTORY — DX: Essential (primary) hypertension: I10

## 2022-01-17 ENCOUNTER — Ambulatory Visit (INDEPENDENT_AMBULATORY_CARE_PROVIDER_SITE_OTHER): Payer: PPO | Admitting: Gastroenterology

## 2022-01-17 ENCOUNTER — Encounter: Payer: Self-pay | Admitting: Gastroenterology

## 2022-01-17 VITALS — BP 150/90 | HR 76 | Ht 65.0 in | Wt 161.2 lb

## 2022-01-17 DIAGNOSIS — K5909 Other constipation: Secondary | ICD-10-CM | POA: Diagnosis not present

## 2022-01-17 DIAGNOSIS — R140 Abdominal distension (gaseous): Secondary | ICD-10-CM | POA: Diagnosis not present

## 2022-01-17 DIAGNOSIS — K581 Irritable bowel syndrome with constipation: Secondary | ICD-10-CM

## 2022-01-17 DIAGNOSIS — Z86010 Personal history of colonic polyps: Secondary | ICD-10-CM | POA: Diagnosis not present

## 2022-01-17 NOTE — Patient Instructions (Addendum)
If you are age 75 or older, your body mass index should be between 23-30. Your Body mass index is 26.83 kg/m?Marland Kitchen If this is out of the aforementioned range listed, please consider follow up with your Primary Care Provider. ? ?If you are age 72 or younger, your body mass index should be between 19-25. Your Body mass index is 26.83 kg/m?Marland Kitchen If this is out of the aformentioned range listed, please consider follow up with your Primary Care Provider.  ? ?________________________________________________________ ? ?The [place] [facility] providers would like to encourage you to use [facility] to communicate with providers for non-urgent requests or questions.  Due to long hold times on the telephone, sending your provider a message by [facility] may be a faster and more efficient way to get a response.  Please allow 48 business hours for a response.  Please remember that this is for non-urgent requests.  ?_______________________________________________________ ? ?You have been scheduled for a colonoscopy. Please follow written instructions given to you at your visit today.  ?Please pick up your prep supplies at the pharmacy within the next 1-3 days. ?If you use inhalers (even only as needed), please bring them with you on the day of your procedure. ? ?Please purchase the following medications over the counter and take as directed: ?Miralax 17g daily in [place] water ? ?We have given you samples of the following medication to take: ?Sutab ? ?Two days before your procedure: Mix 3 packs (or capfuls) of Miralax in 48 ounces of clear liquid and drink at 6pm. ? ?Please call with any questions or concerns. ? ?Thank you, ? ?Dr. Jackquline Denmark ? ? ? ? ? ?We want to thank you for trusting [facility] with your care. All of our staff and providers value the relationships we have built with our patients, and it is an honor to care for you.  ? ?We are writing to let you know that [facility] will close on Jan 22, 2022, and we invite you to continue to see Dr. Carmell Austria and Gerrit Heck at the [facility] [place] office location. We are consolidating our serices at these [facility] practices to better provide care. Our office staff will work with you to ensure a seamless transition.  ? ?Gerrit Heck, DO -Dr. Bryan Lemma will be movig to [facility] at [address] [place] [address], [place], [place] [address], effective Jan 22, 2022.  Contact [phone] to schedule an appointment with him.  ? ?Carmell Austria, MD- Dr. Lyndel Safe will be movig to [facility] at [address], [place], [place] [address], effective Jan 22, 2022.  Contact [phone] to schedule an appointment with him.  ? ?Requesting Medical Records ?If you need to request your medical records, please follow the instructions below. Your medical records are confidential, and a copy can be transferred to another provider or released to you or another person you designate only with your permission. ? ?There are several ways to request your medical records: ?Requests for medical records can be submitted through our practice.   ?You can also request your records electronically, in your MyChart account by selecting the ?Request Health Records? tab.  ?If you need additional information on how to request records, please go to [email or web address] choose Patient Information, then select Request Medical Records. ?To make an appointment or if you have any questions about your health care needs, please contact our office at [phone] and one of our staff members will be glad to assist you. ?[facility] is committed to providing exceptional care for you and our community. Thank you for allowing [place] to serve your health care needs. ?Sincerely, ? ?Windy Canny, Director [facility] ?[place] also offers convenient virtual care options. Sore throat? Sinus problems? Cold or flu symptoms? Get care from the comfort of home with [facility] Video Visits and e-Visits. Learn more about the non-emergency conditions treated and start your virtual visit at [email or web address] ? ?

## 2022-01-17 NOTE — Progress Notes (Signed)
? ? ?Chief Complaint: For GI eval ? ?Referring Provider:  Raina Mina., MD    ? ? ?ASSESSMENT AND PLAN;  ? ?#1. H/O polyps ? ?#2. Chronic constipation/ IBS-C with bloating. Nl TSH ? ?Plan: ?-Colon with 2 day prep with sutabs ?-Miralax 17g po QD with 8OZ water ?-if still with problems, CT AP with contrast. ? ? ?Discussed risks & benefits of colonoscopy. Risks including rare perforation req laparotomy, bleeding after bx/polypectomy req blood transfusion, rarely missing neoplasms, risks of anesthesia/sedation, rare risk of damage to internal organs. Benefits outweigh the risks. Patient agrees to proceed. All the questions were answered. Pt consents to proceed. ? ? ?HPI:   ? ?MAHDIYA MOSSBERG is a 76 y.o. female  ? ?With longstanding constipation " always" ?Recently Dx with HTN, started on amlodipine 5 mg/day ?Little worsening of constipation with bloating ?Has not been drinking enough water ?Takes senna 1/day ?Has been walking considerably. ? ?Due for repeat colonoscopy for history of polyps ? ?No nausea, vomiting, heartburn, regurgitation, odynophagia or dysphagia.  No significant diarrhea.  No melena or hematochezia. No unintentional weight loss.  ? ?No abdominal pain. ? ?No sodas, chocolates, chewing gums, artificial sweeteners and candy. No NSAIDs.  ? ?No history suggestive of lactose or gluten intolerance ? ?Past GI procedures: ?Colonoscopy 10/2016 ([facility]): Colonic polyp s/p polypectomy, mild sigmoid diverticulosis. Rpt 5 yrs ? ?Past Medical History:  ?Diagnosis Date  ? Abnormal CXR 10/06/2018  ? Formatting of this note might be different from the original. She had abnormal CXR that showed COPD but patient does not think she does though she has had second hand smoke exposure, discussed with her recommendation for PFTs she wants to wait on this  ? Cancer [facility])   ? Breast Ca  ? Chronic constipation 11/30/2019  ? Collagen vascular disease ([place])   ? COPD suggested by initial evaluation ([place]) 06/22/2019  ? Dyslipidemia  05/08/2016  ? Dyspnea on exertion 03/10/2020  ? Elevated blood-pressure reading without diagnosis of hypertension 10/06/2018  ? History of breast cancer 12/31/2019  ? Malaise and fatigue 01/20/2016  ? Last Assessment & Plan:  Formatting of this note might be different from the original. Relevant Hx: Course: Daily Update: Today's Plan:update her labs for her and discussed with her energy levels and her sleep which is stable overall  Electronically signed by: Mayer Camel, NP 01/23/16 [phone]  ? Mitral valve prolapse 01/20/2016  ? Last Assessment & Plan:  Relevant Hx: Course: Daily Update: Today's Plan:she follows with Dr. Agustin Cree for her MVP and her [place] was done about year ago and it was stable for her  Electronically signed by: Mayer Camel, NP 01/23/16 [phone]  ? Mixed hyperlipidemia 01/20/2016  ? Last Assessment & Plan:  Relevant Hx: Course: Daily Update: Today's Plan:she is fasting for this and has been trying to watch her diet  Electronically signed by: Mayer Camel, NP 01/23/16 [phone]  ? Osteopenia 01/20/2016  ? Last Assessment & Plan:  Formatting of this note might be different from the original. Relevant Hx: Course: Daily Update: Today's Plan:she is taking Bone Strength for her bones and she still follows with her GYN for her womens health and it was this year and she was advised fosamax but refuses, Dr. Corinna Capra is her provider.  Electronically signed by: Mayer Camel, NP 01/23/16 0831  ? Personal history of radiation therapy   ? 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 Last Assessment & Plan:  Formatting of this note  might be different from the original. Relevant Hx: Course: Daily Update: Today's Plan:she uses the ibuprofen and is careful with this and would like to have the prescription strength version of this as is easier.  Electronically signed by: Mayer Camel, NP 01/23/16 [phone]  ? Thyroid nodule 07/01/2019  ? ? ?Past Surgical  History:  ?Procedure Laterality Date  ? BREAST LUMPECTOMY Left 1999  ? CARPAL TUNNEL RELEASE    ? CATARACT EXTRACTION Bilateral   ? CHOLECYSTECTOMY    ? TONSILLECTOMY  1959  ? ? ?Family History  ?Problem Relation Age of Onset  ? Cervical cancer Mother   ? Diabetes Father   ? Hypertension Father   ? ? ?Social History  ? ?Tobacco Use  ? Smoking status: Never  ? Smokeless tobacco: Never  ?Vaping Use  ? Vaping Use: Never used  ?Substance Use Topics  ? Alcohol use: Not Currently  ? Drug use: Never  ? ? ?Current Outpatient Medications  ?Medication Sig Dispense Refill  ? amLODipine (NORVASC) 5 MG tablet Take 1 tablet (5 mg total) by mouth daily. 90 tablet 3  ? Aspirin-Acetaminophen-Caffeine (EXCEDRIN PO) Take 200 mg by mouth as needed (Headaches).    ? BLACK COHOSH EXTRACT PO Take 100 mg by mouth daily.    ? calcium citrate (CALCITRATE - DOSED IN MG ELEMENTAL CALCIUM) 950 (200 Ca) MG tablet Take 200 mg of elemental calcium by mouth 2 (two) times daily.    ? latanoprost (XALATAN) 0.005 % ophthalmic solution Place 1 drop into both eyes at bedtime.    ? Methylsulfonylmethane (MSM PO) Take 1,000 mg by mouth at bedtime.    ? Multiple Vitamins-Minerals (PRESERVISION AREDS PO) Take by mouth.    ? OVER THE COUNTER MEDICATION Take 1,000 mg by mouth daily. 1000-'400mg'$  Gust Rung Based calcium and citrate    ? pravastatin (PRAVACHOL) 40 MG tablet Take by mouth.    ? senna (SENOKOT) 8.6 MG tablet Take 1 tablet by mouth at bedtime.    ? ?No current facility-administered medications for this visit.  ? ? ?No Known Allergies ? ?Review of Systems:  ?Constitutional: Denies fever, chills, diaphoresis, appetite change and fatigue.  ?HEENT: Denies photophobia, eye pain, redness, hearing loss, ear pain, congestion, sore throat, rhinorrhea, sneezing, mouth sores, neck pain, neck stiffness and tinnitus.   ?Respiratory: Denies SOB, DOE, cough, chest tightness,  and wheezing.   ?Cardiovascular: Denies chest pain, palpitations and leg swelling.   ?Genitourinary: Denies dysuria, urgency, frequency, hematuria, flank pain and difficulty urinating.  ?Musculoskeletal: Denies myalgias, back pain, joint swelling, arthralgias and gait problem.  ?Skin: No rash.  ?Neurological: Denies dizziness, seizures, syncope, weakness, light-headedness, numbness and headaches.  ?Hematological: Denies adenopathy. Easy bruising, personal or family bleeding history  ?Psychiatric/Behavioral: No anxiety or depression ? ?  ? ?Physical Exam:   ? ?BP (!) 150/90   Pulse 76   Ht '5\' 5"'$  (1.651 m)   Wt 161 lb 4 oz (73.1 kg)   SpO2 96%   BMI 26.83 kg/m?  ?Wt Readings from Last 3 Encounters:  ?01/17/22 161 lb 4 oz (73.1 kg)  ?11/07/21 162 lb 12.8 oz (73.8 kg)  ?05/08/21 162 lb 3.2 oz (73.6 kg)  ? ?Constitutional:  Well-developed, in no acute distress. ?Psychiatric: Normal mood and affect. Behavior is normal. ?HEENT: Pupils normal.  Conjunctivae are normal. No scleral icterus. ?Cardiovascular: Normal rate, regular rhythm. No edema ?Pulmonary/chest: Effort normal and breath sounds normal. No wheezing, rales or rhonchi. ?Abdominal: Soft, nondistended. Nontender. Bowel sounds active throughout. There  are no masses palpable. No hepatomegaly. ?Rectal: Deferred ?Neurological: Alert and oriented to person place and time. ?Skin: Skin is warm and dry. No rashes noted. ? ? ? ? ?Carmell Austria, MD 01/17/2022, 10:12 AM ? ?Cc: Raina Mina., MD ? ? ?

## 2022-01-22 ENCOUNTER — Other Ambulatory Visit: Payer: Self-pay | Admitting: Cardiology

## 2022-01-22 ENCOUNTER — Telehealth: Payer: Self-pay | Admitting: Cardiology

## 2022-01-22 NOTE — Telephone Encounter (Signed)
?*  STAT* If patient is at the pharmacy, call can be transferred to refill team. ? ? ?1. Which medications need to be refilled? (please list name of each medication and dose if known)  ? pravastatin (PRAVACHOL) 40 MG tablet  ? ? ?2. Which pharmacy/location (including street and city if local pharmacy) is medication to be sent to? [place], [place] - [address] AT [address] ? ?3. Do they need a 30 day or 90 day supply?  ?90 day ? ?

## 2022-01-24 ENCOUNTER — Telehealth: Payer: Self-pay | Admitting: Gastroenterology

## 2022-01-24 NOTE — Telephone Encounter (Signed)
Inbound call from patient stating she does not believe that she will be able to take the Sutab prep for her procedure on 5/31 with Dr. Lyndel Safe. Patient is requesting a call back to discuss an alternative prep. Please advise.  ?

## 2022-01-24 NOTE — Telephone Encounter (Signed)
Patient will do the sutab for now. She was told to take 1 tablet every 5 minutes and she can slow the additional liquids as long as she is able to get them down and she voiced understanding ?

## 2022-01-31 ENCOUNTER — Encounter: Payer: Self-pay | Admitting: Gastroenterology

## 2022-02-07 ENCOUNTER — Ambulatory Visit (AMBULATORY_SURGERY_CENTER): Payer: PPO | Admitting: Gastroenterology

## 2022-02-07 ENCOUNTER — Encounter: Payer: Self-pay | Admitting: Gastroenterology

## 2022-02-07 VITALS — BP 170/80 | HR 76 | Temp 96.4°F | Resp 12 | Ht 65.0 in | Wt 161.0 lb

## 2022-02-07 DIAGNOSIS — D123 Benign neoplasm of transverse colon: Secondary | ICD-10-CM

## 2022-02-07 DIAGNOSIS — Z86010 Personal history of colonic polyps: Secondary | ICD-10-CM | POA: Diagnosis not present

## 2022-02-07 MED ORDER — SODIUM CHLORIDE 0.9 % IV SOLN: 500.0000 mL | Freq: Once | INTRAVENOUS | Status: DC

## 2022-02-07 NOTE — Progress Notes (Signed)
Chief Complaint: For GI eval  Referring Provider:  Raina Mina., MD      ASSESSMENT AND PLAN;   #1. H/O polyps  #2. Chronic constipation/ IBS-C with bloating. Nl TSH  Plan: -Colon with 2 day prep with sutabs -Miralax 17g po QD with 8OZ water -if still with problems, CT AP with contrast.   Discussed risks & benefits of colonoscopy. Risks including rare perforation req laparotomy, bleeding after bx/polypectomy req blood transfusion, rarely missing neoplasms, risks of anesthesia/sedation, rare risk of damage to internal organs. Benefits outweigh the risks. Patient agrees to proceed. All the questions were answered. Pt consents to proceed.   HPI:    Deanna Walsh is a 76 y.o. female   With longstanding constipation " always" Recently Dx with HTN, started on amlodipine 5 mg/day Little worsening of constipation with bloating Has not been drinking enough water Takes senna 1/day Has been walking considerably.  Due for repeat colonoscopy for history of polyps  No nausea, vomiting, heartburn, regurgitation, odynophagia or dysphagia.  No significant diarrhea.  No melena or hematochezia. No unintentional weight loss.   No abdominal pain.  No sodas, chocolates, chewing gums, artificial sweeteners and candy. No NSAIDs.   No history suggestive of lactose or gluten intolerance  Past GI procedures: Colonoscopy 10/2016 ([facility]): Colonic polyp s/p polypectomy, mild sigmoid diverticulosis. Rpt 5 yrs  Past Medical History:  Diagnosis Date   Abnormal CXR 10/06/2018   Formatting of this note might be different from the original. She had abnormal CXR that showed COPD but patient does not think she does though she has had second hand smoke exposure, discussed with her recommendation for PFTs she wants to wait on this   Cancer [facility])    Breast Ca   Chronic constipation 11/30/2019   Collagen vascular disease ([place])    COPD suggested by initial evaluation ([place]) 06/22/2019    Dyslipidemia 05/08/2016   Dyspnea on exertion 03/10/2020   Elevated blood-pressure reading without diagnosis of hypertension 10/06/2018   History of breast cancer 12/31/2019   Hypertension    Malaise and fatigue 01/20/2016   Last Assessment & Plan:  Formatting of this note might be different from the original. Relevant Hx: Course: Daily Update: Today's Plan:update her labs for her and discussed with her energy levels and her sleep which is stable overall  Electronically signed by: Mayer Camel, NP 01/23/16 [phone]   Mitral valve prolapse 01/20/2016   Last Assessment & Plan:  Relevant Hx: Course: Daily Update: Today's Plan:she follows with Dr. Agustin Cree for her MVP and her [place] was done about year ago and it was stable for her  Electronically signed by: Mayer Camel, NP 01/23/16 0835   Mixed hyperlipidemia 01/20/2016   Last Assessment & Plan:  Relevant Hx: Course: Daily Update: Today's Plan:she is fasting for this and has been trying to watch her diet  Electronically signed by: Mayer Camel, NP 01/23/16 0832   Osteopenia 01/20/2016   Last Assessment & Plan:  Formatting of this note might be different from the original. Relevant Hx: Course: Daily Update: Today's Plan:she is taking Bone Strength for her bones and she still follows with her GYN for her womens health and it was this year and she was advised fosamax but refuses, Dr. Corinna Capra is her provider.  Electronically signed by: Mayer Camel, NP 01/23/16 0831   Personal history of radiation therapy    Primary osteoarthritis involving multiple joints 01/23/2016   Last Assessment & Plan:  Formatting of this note might be different from the original. Relevant Hx: Course: Daily Update: Today's Plan:she uses the ibuprofen and is careful with this and would like to have the prescription strength version of this as is easier.  Electronically signed by: Mayer Camel, NP 01/23/16 [phone]   Thyroid  nodule 07/01/2019    Past Surgical History:  Procedure Laterality Date   BREAST LUMPECTOMY Left 1999   CARPAL TUNNEL RELEASE     CATARACT EXTRACTION Bilateral    CHOLECYSTECTOMY     COLONOSCOPY     TONSILLECTOMY  1959    Family History  Problem Relation Age of Onset   Cervical cancer Mother    Diabetes Father    Hypertension Father    Colon cancer Neg Hx    Esophageal cancer Neg Hx    Rectal cancer Neg Hx    Stomach cancer Neg Hx     Social History   Tobacco Use   Smoking status: Never   Smokeless tobacco: Never  Vaping Use   Vaping Use: Never used  Substance Use Topics   Alcohol use: Not Currently   Drug use: Never    Current Outpatient Medications  Medication Sig Dispense Refill   amLODipine (NORVASC) 5 MG tablet Take 1 tablet (5 mg total) by mouth daily. 90 tablet 3   Aspirin-Acetaminophen-Caffeine (EXCEDRIN PO) Take 200 mg by mouth as needed (Headaches).     BLACK COHOSH EXTRACT PO Take 100 mg by mouth daily.     calcium citrate (CALCITRATE - DOSED IN MG ELEMENTAL CALCIUM) 950 (200 Ca) MG tablet Take 200 mg of elemental calcium by mouth 2 (two) times daily.     latanoprost (XALATAN) 0.005 % ophthalmic solution Place 1 drop into both eyes at bedtime.     Methylsulfonylmethane (MSM PO) Take 1,000 mg by mouth at bedtime.     Multiple Vitamins-Minerals (PRESERVISION AREDS PO) Take by mouth.     OVER THE COUNTER MEDICATION Take 1,000 mg by mouth daily. 1000-'400mg'$  Gust Rung Based calcium and citrate     polyethylene glycol (MIRALAX / GLYCOLAX) 17 g packet Take 17 g by mouth daily.     pravastatin (PRAVACHOL) 40 MG tablet TAKE 1 TABLET(40 MG) BY MOUTH EVERY EVENING 90 tablet 3   senna (SENOKOT) 8.6 MG tablet Take 1 tablet by mouth at bedtime. (Patient not taking: Reported on 02/07/2022)     Current Facility-Administered Medications  Medication Dose Route Frequency Provider Last Rate Last Admin   0.9 %  sodium chloride infusion  500 mL Intravenous Once Jackquline Denmark, MD         No Known Allergies  Review of Systems:  Constitutional: Denies fever, chills, diaphoresis, appetite change and fatigue.  HEENT: Denies photophobia, eye pain, redness, hearing loss, ear pain, congestion, sore throat, rhinorrhea, sneezing, mouth sores, neck pain, neck stiffness and tinnitus.   Respiratory: Denies SOB, DOE, cough, chest tightness,  and wheezing.   Cardiovascular: Denies chest pain, palpitations and leg swelling.  Genitourinary: Denies dysuria, urgency, frequency, hematuria, flank pain and difficulty urinating.  Musculoskeletal: Denies myalgias, back pain, joint swelling, arthralgias and gait problem.  Skin: No rash.  Neurological: Denies dizziness, seizures, syncope, weakness, light-headedness, numbness and headaches.  Hematological: Denies adenopathy. Easy bruising, personal or family bleeding history  Psychiatric/Behavioral: No anxiety or depression     Physical Exam:    BP 130/66   Pulse 75   Temp (!) 96.4 F (35.8 C) (Temporal)   Ht '5\' 5"'$  (1.651 m)   Wt  161 lb (73 kg)   SpO2 98%   BMI 26.79 kg/m  Wt Readings from Last 3 Encounters:  02/07/22 161 lb (73 kg)  01/17/22 161 lb 4 oz (73.1 kg)  11/07/21 162 lb 12.8 oz (73.8 kg)   Constitutional:  Well-developed, in no acute distress. Psychiatric: Normal mood and affect. Behavior is normal. HEENT: Pupils normal.  Conjunctivae are normal. No scleral icterus. Cardiovascular: Normal rate, regular rhythm. No edema Pulmonary/chest: Effort normal and breath sounds normal. No wheezing, rales or rhonchi. Abdominal: Soft, nondistended. Nontender. Bowel sounds active throughout. There are no masses palpable. No hepatomegaly. Rectal: Deferred Neurological: Alert and oriented to person place and time. Skin: Skin is warm and dry. No rashes noted.     Carmell Austria, MD 02/07/2022, 9:00 AM  Cc: Raina Mina., MD

## 2022-02-07 NOTE — Progress Notes (Signed)
Pt's states no medical or surgical changes since previsit or office visit. 

## 2022-02-07 NOTE — Progress Notes (Signed)
Called to room to assist during endoscopic procedure.  Patient ID and intended procedure confirmed with present staff. Received instructions for my participation in the procedure from the performing physician.  

## 2022-02-07 NOTE — Patient Instructions (Signed)

## 2022-02-07 NOTE — Op Note (Signed)
[facility] Patient Name: Deanna Walsh Procedure Date: 02/07/2022 9:03 AM MRN: [id number] Endoscopist: Jackquline Denmark , MD Age: 76 Referring MD:  Date of Birth: Nov 04, 1945 Gender: Female Account #: [id number] Procedure:                Colonoscopy Indications:              High risk colon cancer surveillance: Personal                            history of colonic polyps Medicines:                Monitored Anesthesia Care Procedure:                Pre-Anesthesia Assessment:                           - Prior to the procedure, a History and Physical                            was performed, and patient medications and                            allergies were reviewed. The patient's tolerance of                            previous anesthesia was also reviewed. The risks                            and benefits of the procedure and the sedation                            options and risks were discussed with the patient.                            All questions were answered, and informed consent                            was obtained. Prior Anticoagulants: The patient has                            taken no previous anticoagulant or antiplatelet                            agents. ASA Grade Assessment: II - A patient with                            mild systemic disease. After reviewing the risks                            and benefits, the patient was deemed in                            satisfactory condition to undergo the procedure.  After obtaining informed consent, the colonoscope                            was passed under direct vision. Throughout the                            procedure, the patient's blood pressure, pulse, and                            oxygen saturations were monitored continuously. The                            0441 PCF-H190TL Slim SB Colonoscope was introduced                            through the anus and advanced to the 1  cm into the                            ileum. The colonoscopy was performed without                            difficulty. The patient tolerated the procedure                            well. The quality of the bowel preparation was                            good. The terminal ileum, ileocecal valve,                            appendiceal orifice, and rectum were photographed. Scope In: 9:06:28 AM Scope Out: 9:25:52 AM Scope Withdrawal Time: 0 hours 15 minutes 42 seconds  Total Procedure Duration: 0 hours 19 minutes 24 seconds  Findings:                 A 6 mm polyp was found in the mid transverse colon.                            The polyp was sessile. The polyp was removed with a                            cold snare. Resection and retrieval were complete.                           A few medium-mouthed diverticula were found in the                            sigmoid colon.                           Non-bleeding internal hemorrhoids were found during                            retroflexion. The hemorrhoids were  small and Grade                            I (internal hemorrhoids that do not prolapse).                           The terminal ileum appeared normal.                           The exam was otherwise without abnormality on                            direct and retroflexion views. Complications:            No immediate complications. Estimated Blood Loss:     Estimated blood loss: none. Impression:               - One 6 mm polyp in the mid transverse colon,                            removed with a cold snare. Resected and retrieved.                           - Mild sigmoid diverticulosis.                           - Non-bleeding internal hemorrhoids.                           - The examined portion of the ileum was normal.                           - The examination was otherwise normal on direct                            and retroflexion views. Recommendation:           -  Patient has a contact number available for                            emergencies. The signs and symptoms of potential                            delayed complications were discussed with the                            patient. Return to normal activities tomorrow.                            Written discharge instructions were provided to the                            patient.                           - Resume previous diet.                           -  Continue present medications.                           - Await pathology results.                           - Repeat colonoscopy for surveillance based on                            pathology results.                           - The findings and recommendations were discussed                            with the patient's family. Jackquline Denmark, MD 02/07/2022 9:29:58 AM This report has been signed electronically.

## 2022-02-07 NOTE — Progress Notes (Signed)
Report to PACU, RN, vss, BBS= Clear.  

## 2022-02-08 ENCOUNTER — Telehealth: Payer: Self-pay | Admitting: *Deleted

## 2022-02-08 NOTE — Telephone Encounter (Signed)
  Follow up Call-     02/07/2022    8:27 AM  Call back number  Post procedure Call Back phone  # [phone]  Permission to leave phone message Yes     Patient questions:  Do you have a fever, pain , or abdominal swelling? No. Pain Score  0 *  Have you tolerated food without any problems? Yes.    Have you been able to return to your normal activities? Yes.    Do you have any questions about your discharge instructions: Diet   No. Medications  No. Follow up visit  No.  Do you have questions or concerns about your Care? No.  Actions: * If pain score is 4 or above: No action needed, pain <4.

## 2022-02-10 ENCOUNTER — Encounter: Payer: Self-pay | Admitting: Gastroenterology

## 2022-02-19 ENCOUNTER — Telehealth: Payer: Self-pay | Admitting: Gastroenterology

## 2022-02-19 DIAGNOSIS — R040 Epistaxis: Secondary | ICD-10-CM

## 2022-02-19 DIAGNOSIS — J343 Hypertrophy of nasal turbinates: Secondary | ICD-10-CM | POA: Insufficient documentation

## 2022-02-19 DIAGNOSIS — J342 Deviated nasal septum: Secondary | ICD-10-CM | POA: Insufficient documentation

## 2022-02-19 HISTORY — DX: Epistaxis: R04.0

## 2022-02-19 NOTE — Telephone Encounter (Signed)
Inbound call from patient requesting a call back to discuss the polyp that was removed during her colonoscopy. Please advise.

## 2022-02-20 NOTE — Telephone Encounter (Signed)
Pt was made aware of her recent results from her Colonoscopy from the letter that was created by Dr. Lyndel Safe: Pt verbalized understanding with all questions answered.

## 2022-03-20 ENCOUNTER — Telehealth: Payer: Self-pay

## 2022-03-20 ENCOUNTER — Telehealth: Payer: Self-pay | Admitting: Cardiology

## 2022-03-20 ENCOUNTER — Other Ambulatory Visit: Payer: Self-pay

## 2022-03-20 DIAGNOSIS — E782 Mixed hyperlipidemia: Secondary | ICD-10-CM

## 2022-03-20 DIAGNOSIS — I341 Nonrheumatic mitral (valve) prolapse: Secondary | ICD-10-CM

## 2022-03-20 DIAGNOSIS — R0609 Other forms of dyspnea: Secondary | ICD-10-CM

## 2022-03-20 DIAGNOSIS — E785 Hyperlipidemia, unspecified: Secondary | ICD-10-CM

## 2022-03-20 NOTE — Telephone Encounter (Signed)
Ordered CT Calcium Score per pt request and per Dr. Agustin Cree order. Notified pt that order had been submitted. Pt verbalized understanding and had no further questions.

## 2022-03-20 NOTE — Telephone Encounter (Signed)
Patient states she would like to have the test that checks the amount of calcium in arteries.

## 2022-03-28 ENCOUNTER — Ambulatory Visit (HOSPITAL_BASED_OUTPATIENT_CLINIC_OR_DEPARTMENT_OTHER)
Admission: RE | Admit: 2022-03-28 | Discharge: 2022-03-28 | Disposition: A | Discharge status: Home / Self Care | Payer: PPO | Source: Ambulatory Visit | Attending: Cardiology | Admitting: Cardiology

## 2022-03-28 DIAGNOSIS — I341 Nonrheumatic mitral (valve) prolapse: Secondary | ICD-10-CM | POA: Insufficient documentation

## 2022-03-28 DIAGNOSIS — R0609 Other forms of dyspnea: Secondary | ICD-10-CM | POA: Insufficient documentation

## 2022-03-28 DIAGNOSIS — E782 Mixed hyperlipidemia: Secondary | ICD-10-CM | POA: Insufficient documentation

## 2022-03-28 DIAGNOSIS — E785 Hyperlipidemia, unspecified: Secondary | ICD-10-CM | POA: Insufficient documentation

## 2022-04-03 ENCOUNTER — Telehealth: Payer: Self-pay

## 2022-04-03 NOTE — Telephone Encounter (Signed)
LVM per DPR-Per Dr. Wendy Poet note. Encouraged pt call with any questions.

## 2022-04-10 DIAGNOSIS — F09 Unspecified mental disorder due to known physiological condition: Secondary | ICD-10-CM | POA: Insufficient documentation

## 2022-04-11 ENCOUNTER — Other Ambulatory Visit: Payer: Self-pay | Admitting: Specialist

## 2022-04-11 DIAGNOSIS — F09 Unspecified mental disorder due to known physiological condition: Secondary | ICD-10-CM

## 2022-04-17 ENCOUNTER — Ambulatory Visit
Admission: RE | Admit: 2022-04-17 | Discharge: 2022-04-17 | Disposition: A | Discharge status: Home / Self Care | Payer: PPO | Source: Ambulatory Visit | Attending: Specialist | Admitting: Specialist

## 2022-04-17 DIAGNOSIS — F09 Unspecified mental disorder due to known physiological condition: Secondary | ICD-10-CM

## 2022-04-17 MED ORDER — GADOBENATE DIMEGLUMINE 529 MG/ML IV SOLN
15.0000 mL | Freq: Once | INTRAVENOUS | Status: AC | PRN
  Administered 2022-04-17: 15 mL via INTRAVENOUS

## 2022-04-20 ENCOUNTER — Other Ambulatory Visit: Payer: PPO

## 2022-05-09 ENCOUNTER — Other Ambulatory Visit: Payer: Self-pay

## 2022-05-09 ENCOUNTER — Ambulatory Visit: Payer: PPO | Attending: Cardiology | Admitting: Cardiology

## 2022-05-09 ENCOUNTER — Encounter: Payer: Self-pay | Admitting: Cardiology

## 2022-05-09 ENCOUNTER — Telehealth: Payer: Self-pay | Admitting: Cardiology

## 2022-05-09 ENCOUNTER — Other Ambulatory Visit (HOSPITAL_COMMUNITY): Payer: Self-pay

## 2022-05-09 VITALS — BP 120/68 | HR 75 | Ht 65.5 in | Wt 161.6 lb

## 2022-05-09 DIAGNOSIS — I341 Nonrheumatic mitral (valve) prolapse: Secondary | ICD-10-CM | POA: Diagnosis not present

## 2022-05-09 DIAGNOSIS — I2510 Atherosclerotic heart disease of native coronary artery without angina pectoris: Secondary | ICD-10-CM | POA: Diagnosis not present

## 2022-05-09 DIAGNOSIS — G629 Polyneuropathy, unspecified: Secondary | ICD-10-CM

## 2022-05-09 DIAGNOSIS — I2584 Coronary atherosclerosis due to calcified coronary lesion: Secondary | ICD-10-CM

## 2022-05-09 DIAGNOSIS — J449 Chronic obstructive pulmonary disease, unspecified: Secondary | ICD-10-CM | POA: Diagnosis not present

## 2022-05-09 DIAGNOSIS — I10 Essential (primary) hypertension: Secondary | ICD-10-CM

## 2022-05-09 HISTORY — DX: Atherosclerotic heart disease of native coronary artery without angina pectoris: I25.10

## 2022-05-09 NOTE — Telephone Encounter (Signed)
Patient states she is calling to see if a nerve study and EMG study was received. She says they should have been sent from Dr. Arnoldo Morale at [facility]. She says she spoke with the nurse yesterday and they had not received it, but someone was supposed to check somewhere else.

## 2022-05-09 NOTE — Progress Notes (Unsigned)
Cardiology Office Note:    Date:  05/09/2022   ID:  Deanna, Walsh 05-May-1946, MRN [id number]  PCP:  Raina Mina., MD  Cardiologist:  Jenne Campus, MD    Referring MD: Raina Mina., MD   Chief Complaint  Patient presents with   Results    History of Present Illness:    Deanna Walsh is a 76 y.o. female with past medical history significant for essential hypertension, dyslipidemia, some cognitive features.  Recently she got some extensive work-up done for that that included MRI which showed some white matter disease, she is still very concerned about it as she requested to have a calcium score calcium score came very low 8.85.  She was taking cholesterol medication but developed some neuropathy thinks is related to the medication she discontinue that.  Complain of being weak tired fatigue she said all the time she was going fast and doing everything now she cannot do this  Past Medical History:  Diagnosis Date   Abnormal CXR 10/06/2018   Formatting of this note might be different from the original. She had abnormal CXR that showed COPD but patient does not think she does though she has had second hand smoke exposure, discussed with her recommendation for PFTs she wants to wait on this   [facility])    Breast Ca   Chronic constipation 11/30/2019   Collagen vascular disease ([place])    COPD suggested by initial evaluation ([place]) 06/22/2019   Dyslipidemia 05/08/2016   Dyspnea on exertion 03/10/2020   Elevated blood-pressure reading without diagnosis of hypertension 10/06/2018   History of breast cancer 12/31/2019   Hypertension    Malaise and fatigue 01/20/2016   Last Assessment & Plan:  Formatting of this note might be different from the original. Relevant Hx: Course: Daily Update: Today's Plan:update her labs for her and discussed with her energy levels and her sleep which is stable overall  Electronically signed by: Mayer Camel, NP 01/23/16 [phone]    Mitral valve prolapse 01/20/2016   Last Assessment & Plan:  Relevant Hx: Course: Daily Update: Today's Plan:she follows with Dr. Agustin Cree for her MVP and her [place] was done about year ago and it was stable for her  Electronically signed by: Mayer Camel, NP 01/23/16 0835   Mixed hyperlipidemia 01/20/2016   Last Assessment & Plan:  Relevant Hx: Course: Daily Update: Today's Plan:she is fasting for this and has been trying to watch her diet  Electronically signed by: Mayer Camel, NP 01/23/16 0832   Osteopenia 01/20/2016   Last Assessment & Plan:  Formatting of this note might be different from the original. Relevant Hx: Course: Daily Update: Today's Plan:she is taking Bone Strength for her bones and she still follows with her GYN for her womens health and it was this year and she was advised fosamax but refuses, Dr. Corinna Capra is her provider.  Electronically signed by: Mayer Camel, NP 01/23/16 0831   Personal history of radiation therapy    Primary osteoarthritis involving multiple joints 01/23/2016   Last Assessment & Plan:  Formatting of this note might be different from the original. Relevant Hx: Course: Daily Update: Today's Plan:she uses the ibuprofen and is careful with this and would like to have the prescription strength version of this as is easier.  Electronically signed by: Mayer Camel, NP 01/23/16 [phone]   Thyroid nodule 07/01/2019    Past Surgical History:  Procedure Laterality Date   BREAST LUMPECTOMY Left  1999   CARPAL TUNNEL RELEASE     CATARACT EXTRACTION Bilateral    CHOLECYSTECTOMY     COLONOSCOPY     TONSILLECTOMY  1959    Current Medications: Current Meds  Medication Sig   amLODipine (NORVASC) 5 MG tablet Take 1 tablet (5 mg total) by mouth daily.   Aspirin-Acetaminophen-Caffeine (EXCEDRIN PO) Take 200 mg by mouth as needed (Headaches).   BLACK COHOSH EXTRACT PO Take 100 mg by mouth daily.   calcium citrate (CALCITRATE -  DOSED IN MG ELEMENTAL CALCIUM) 950 (200 Ca) MG tablet Take 200 mg of elemental calcium by mouth 2 (two) times daily.   latanoprost (XALATAN) 0.005 % ophthalmic solution Place 1 drop into both eyes at bedtime.   Methylsulfonylmethane (MSM PO) Take 1,000 mg by mouth at bedtime.   Multiple Vitamins-Minerals (PRESERVISION AREDS PO) Take 1 tablet by mouth daily.   OVER THE COUNTER MEDICATION Take 1,000 mg by mouth daily. 1000-'400mg'$  [place] Based calcium and citrate   polyethylene glycol (MIRALAX / GLYCOLAX) 17 g packet Take 17 g by mouth daily.   pravastatin (PRAVACHOL) 40 MG tablet TAKE 1 TABLET(40 MG) BY MOUTH EVERY EVENING (Patient taking differently: Take 40 mg by mouth daily.)   senna (SENOKOT) 8.6 MG tablet Take 1 tablet by mouth at bedtime.     Allergies:   Patient has no known allergies.   Social History   Socioeconomic History   Marital status: Married    Spouse name: Not on file   Number of children: Not on file   Years of education: Not on file   Highest education level: Not on file  Occupational History   Not on file  Tobacco Use   Smoking status: Never   Smokeless tobacco: Never  Vaping Use   Vaping Use: Never used  Substance and Sexual Activity   Alcohol use: Not Currently   Drug use: Never   Sexual activity: Not on file  Other Topics Concern   Not on file  Social History Narrative   Not on file   Social Determinants of Health   Financial Resource Strain: Not on file  Food Insecurity: Not on file  Transportation Needs: Not on file  Physical Activity: Not on file  Stress: Not on file  Social Connections: Not on file     Family History: The patient's family history includes Cervical cancer in her mother; Diabetes in her father; Hypertension in her father. There is no history of Colon cancer, Esophageal cancer, Rectal cancer, or Stomach cancer. ROS:   Please see the history of present illness.    All 14 point review of systems negative except as described per  history of present illness  EKGs/Labs/Other Studies Reviewed:      Recent Labs: 08/17/2021: ALT 13  Recent Lipid Panel    Component Value Date/Time   CHOL 174 08/17/2021 0923   TRIG 52 08/17/2021 0923   HDL 76 08/17/2021 0923   CHOLHDL 2.3 08/17/2021 0923   LDLCALC 88 08/17/2021 0923    Physical Exam:    VS:  BP 120/68 (BP Location: Left Arm, Patient Position: Sitting)   Pulse 75   Ht 5' 5.5" (1.664 m)   Wt 161 lb 9.6 oz (73.3 kg)   SpO2 95%   BMI 26.48 kg/m     Wt Readings from Last 3 Encounters:  05/09/22 161 lb 9.6 oz (73.3 kg)  02/07/22 161 lb (73 kg)  01/17/22 161 lb 4 oz (73.1 kg)     GEN:  Well nourished, well developed in no acute distress HEENT: Normal NECK: No JVD; No carotid bruits LYMPHATICS: No lymphadenopathy CARDIAC: RRR, no murmurs, no rubs, no gallops RESPIRATORY:  Clear to auscultation without rales, wheezing or rhonchi  ABDOMEN: Soft, non-tender, non-distended MUSCULOSKELETAL:  No edema; No deformity  SKIN: Warm and dry LOWER EXTREMITIES: no swelling NEUROLOGIC:  Alert and oriented x 3 PSYCHIATRIC:  Normal affect   ASSESSMENT:    1. Essential hypertension   2. Mitral valve prolapse   3. COPD suggested by initial evaluation ([place])   4. Calcification of coronary artery    PLAN:    In order of problems listed above:  Essential hypertension blood pressure well controlled continue present management. Mitral valve prolapse a problem with mild mitral regurgitation doing well from that point review COPD stable Calcification of coronary artery ideally need to have an statin however was not able to tolerate it we will check her cholesterol and eventually probably will initiate Zetia   Medication Adjustments/Labs and Tests Ordered: Current medicines are reviewed at length with the patient today.  Concerns regarding medicines are outlined above.  No orders of the defined types were placed in this encounter.  Medication changes: No orders of the  defined types were placed in this encounter.   Signed, Park Liter, MD, [facility] 05/09/2022 1:54 PM    [place]

## 2022-05-09 NOTE — Patient Instructions (Signed)
Medication Instructions:  Your physician recommends that you continue on your current medications as directed. Please refer to the Current Medication list given to you today.  *If you need a refill on your cardiac medications before your next appointment, please call your pharmacy*   Lab Work: Lipid profile- today   Testing/Procedures: None   Follow-Up: At [facility], you and your health needs are our priority.  As part of our continuing mission to provide you with exceptional heart care, we have created designated Provider Care Teams.  These Care Teams include your primary Cardiologist (physician) and Advanced Practice Providers (APPs -  Physician Assistants and Nurse Practitioners) who all work together to provide you with the care you need, when you need it.  We recommend signing up for the patient portal called "MyChart".  Sign up information is provided on this After Visit Summary.  MyChart is used to connect with patients for Virtual Visits (Telemedicine).  Patients are able to view lab/test results, encounter notes, upcoming appointments, etc.  Non-urgent messages can be sent to your provider as well.   To learn more about what you can do with MyChart, go to [email or web address].    Your next appointment:   6 month(s)  The format for your next appointment:   In Person  Provider:   Jenne Campus, MD    Other Instructions Referral to Neurology- They will call for appt

## 2022-05-11 LAB — LIPID PANEL
Chol/HDL Ratio: 3.4 ratio (ref 0.0–4.4)
Cholesterol, Total: 222 mg/dL — ABNORMAL HIGH (ref 100–199)
HDL: 65 mg/dL (ref >39)
LDL Chol Calc (NIH): 144 mg/dL — ABNORMAL HIGH (ref 0–99)
Triglycerides: 73 mg/dL (ref 0–149)
VLDL Cholesterol Cal: 13 mg/dL (ref 5–40)

## 2022-05-15 ENCOUNTER — Telehealth: Payer: Self-pay

## 2022-05-15 NOTE — Telephone Encounter (Signed)
Spoke with pt about her lab results. She stated that she is currently going through tests to find out what is causing her feet numbness. She stated that she agrees to the increase in the Pravastatin but will not start it right away. She will call for a refill and let us know when she is coming for blood work. She stated that she and Dr.Krasowski had discussed this at the Office visit. Routed to PCP

## 2022-05-16 NOTE — Telephone Encounter (Signed)
Patient aware medical records otained

## 2022-05-30 ENCOUNTER — Other Ambulatory Visit: Payer: Self-pay | Admitting: Internal Medicine

## 2022-05-30 DIAGNOSIS — Z1231 Encounter for screening mammogram for malignant neoplasm of breast: Secondary | ICD-10-CM

## 2022-06-06 ENCOUNTER — Encounter: Payer: Self-pay | Admitting: *Deleted

## 2022-06-06 ENCOUNTER — Ambulatory Visit: Payer: PPO | Admitting: Diagnostic Neuroimaging

## 2022-06-06 VITALS — BP 141/78 | HR 78 | Ht 65.5 in | Wt 164.0 lb

## 2022-06-06 DIAGNOSIS — R5381 Other malaise: Secondary | ICD-10-CM | POA: Diagnosis not present

## 2022-06-06 DIAGNOSIS — R200 Anesthesia of skin: Secondary | ICD-10-CM

## 2022-06-06 DIAGNOSIS — R413 Other amnesia: Secondary | ICD-10-CM

## 2022-06-06 DIAGNOSIS — M19049 Primary osteoarthritis, unspecified hand: Secondary | ICD-10-CM | POA: Insufficient documentation

## 2022-06-06 NOTE — Patient Instructions (Addendum)
  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

## 2022-06-06 NOTE — Progress Notes (Signed)
[facility]  PATIENT: Deanna Walsh DOB: February 17, 1946  REFERRING CLINICIAN: Bess Harvest, Melissa J* HISTORY FROM: patient  REASON FOR VISIT: new consult   HISTORICAL  CHIEF COMPLAINT:  Chief Complaint  Patient presents with   New Patient (Initial Visit)    Pt reports her feet are half numb all the time. She states she has no tingling or no pain. Mainly the heel. She states the numbness is going up her leg. Room 7 alone.    HISTORY OF PRESENT ILLNESS:   76 year old female here for evaluation of memory and cognitive difficulties, as well as numbness and tingling in the feet.  Patient has had chronic low back pain for many years.  Also has had numbness sensation in her heels for at least 20 years.  Last 1 to 2 years having more numbness and lack of feeling in the toes.  In July 2022 she had COVID and around this time she had significant decrease in energy shortness of breath dyspnea on exertion.  Also having some issues with short-term memory and cognitive difficulties.  Also averaging about 6 hours of sleep per night.    REVIEW OF SYSTEMS: Full 14 system review of systems performed and negative with exception of: As per HPI.  ALLERGIES: No Known Allergies  HOME MEDICATIONS: Outpatient Medications Prior to Visit  Medication Sig Dispense Refill   amLODipine (NORVASC) 5 MG tablet Take 1 tablet (5 mg total) by mouth daily. 90 tablet 3   Aspirin-Acetaminophen-Caffeine (EXCEDRIN PO) Take 200 mg by mouth as needed (Headaches).     BLACK COHOSH EXTRACT PO Take 100 mg by mouth daily.     calcium citrate (CALCITRATE - DOSED IN MG ELEMENTAL CALCIUM) 950 (200 Ca) MG tablet Take 200 mg of elemental calcium by mouth 2 (two) times daily.     ibuprofen (ADVIL) 800 MG tablet Take 800 mg by mouth every 8 (eight) hours as needed.     latanoprost (XALATAN) 0.005 % ophthalmic solution Place 1 drop into both eyes at bedtime.     Methylsulfonylmethane (MSM PO) Take 1,000 mg by  mouth at bedtime.     Multiple Vitamins-Minerals (PRESERVISION AREDS PO) Take 1 tablet by mouth daily.     polyethylene glycol (MIRALAX / GLYCOLAX) 17 g packet Take 17 g by mouth daily.     OVER THE COUNTER MEDICATION Take 1,000 mg by mouth daily. 1000-'400mg'$  Gust Rung Based calcium and citrate (Patient not taking: Reported on 06/06/2022)     pravastatin (PRAVACHOL) 40 MG tablet TAKE 1 TABLET(40 MG) BY MOUTH EVERY EVENING (Patient not taking: Reported on 06/06/2022) 90 tablet 3   senna (SENOKOT) 8.6 MG tablet Take 1 tablet by mouth at bedtime. (Patient not taking: Reported on 06/06/2022)     No facility-administered medications prior to visit.    PAST MEDICAL HISTORY: Past Medical History:  Diagnosis Date   Abnormal CXR 10/06/2018   Formatting of this note might be different from the original. She had abnormal CXR that showed COPD but patient does not think she does though she has had second hand smoke exposure, discussed with her recommendation for PFTs she wants to wait on this   [facility]cer [facility])    Breast Ca   Chronic constipation 11/30/2019   Collagen vascular disease ([place])    COPD suggested by initial evaluation ([place]) 06/22/2019   Dyslipidemia 05/08/2016   Dyspnea on exertion 03/10/2020   Elevated blood-pressure reading without diagnosis of hypertension 10/06/2018   History of breast cancer 12/31/2019  Hypertension    Malaise and fatigue 01/20/2016   Last Assessment & Plan:  Formatting of this note might be different from the original. Relevant Hx: Course: Daily Update: Today's Plan:update her labs for her and discussed with her energy levels and her sleep which is stable overall  Electronically signed by: Mayer Camel, NP 01/23/16 [phone]   Mitral valve prolapse 01/20/2016   Last Assessment & Plan:  Relevant Hx: Course: Daily Update: Today's Plan:she follows with Dr. Agustin Cree for her MVP and her [place] was done about year ago and it was stable for her  Electronically signed by:  Mayer Camel, NP 01/23/16 0835   Mixed hyperlipidemia 01/20/2016   Last Assessment & Plan:  Relevant Hx: Course: Daily Update: Today's Plan:she is fasting for this and has been trying to watch her diet  Electronically signed by: Mayer Camel, NP 01/23/16 0832   Neuropathy    Osteopenia 01/20/2016   Last Assessment & Plan:  Formatting of this note might be different from the original. Relevant Hx: Course: Daily Update: Today's Plan:she is taking Bone Strength for her bones and she still follows with her GYN for her womens health and it was this year and she was advised fosamax but refuses, Dr. Corinna Capra is her provider.  Electronically signed by: Mayer Camel, NP 01/23/16 0831   Personal history of radiation therapy    Primary osteoarthritis involving multiple joints 01/23/2016   Last Assessment & Plan:  Formatting of this note might be different from the original. Relevant Hx: Course: Daily Update: Today's Plan:she uses the ibuprofen and is careful with this and would like to have the prescription strength version of this as is easier.  Electronically signed by: Mayer Camel, NP 01/23/16 [phone]   Thyroid nodule 07/01/2019    PAST SURGICAL HISTORY: Past Surgical History:  Procedure Laterality Date   BREAST LUMPECTOMY Left 1999   CARPAL TUNNEL RELEASE     CATARACT EXTRACTION Bilateral    CHOLECYSTECTOMY     COLONOSCOPY     TONSILLECTOMY  1959    FAMILY HISTORY: Family History  Problem Relation Age of Onset   Cervical cancer Mother    Diabetes Father    Hypertension Father    Colon cancer Neg Hx    Esophageal cancer Neg Hx    Rectal cancer Neg Hx    Stomach cancer Neg Hx     SOCIAL HISTORY: Social History   Socioeconomic History   Marital status: Married    Spouse name: Not on file   Number of children: Not on file   Years of education: Not on file   Highest education level: Not on file  Occupational History   Not on file   Tobacco Use   Smoking status: Never   Smokeless tobacco: Never  Vaping Use   Vaping Use: Never used  Substance and Sexual Activity   Alcohol use: Not Currently   Drug use: Never   Sexual activity: Not on file  Other Topics Concern   Not on file  Social History Narrative   Not on file   Social Determinants of Health   Financial Resource Strain: Not on file  Food Insecurity: Not on file  Transportation Needs: Not on file  Physical Activity: Not on file  Stress: Not on file  Social Connections: Not on file  Intimate Partner Violence: Not on file     PHYSICAL EXAM  GENERAL EXAM/CONSTITUTIONAL: Vitals:  Vitals:   06/06/22 1101  BP: (!) 141/78  Pulse: 78  Weight: 164 lb (74.4 kg)  Height: 5' 5.5" (1.664 m)   Body mass index is 26.88 kg/m. Wt Readings from Last 3 Encounters:  06/06/22 164 lb (74.4 kg)  05/09/22 161 lb 9.6 oz (73.3 kg)  02/07/22 161 lb (73 kg)   Patient is in no distress; well developed, nourished and groomed; neck is supple  CARDIOVASCULAR: Examination of carotid arteries is normal; no carotid bruits Regular rate and rhythm, no murmurs Examination of peripheral vascular system by observation and palpation is normal  EYES: Ophthalmoscopic exam of optic discs and posterior segments is normal; no papilledema or hemorrhages No results found.  MUSCULOSKELETAL: Gait, strength, tone, movements noted in Neurologic exam below  NEUROLOGIC: MENTAL STATUS:      No data to display         awake, alert, oriented to person, place and time recent and remote memory intact normal attention and concentration language fluent, comprehension intact, naming intact fund of knowledge appropriate  CRANIAL NERVE:  2nd - no papilledema on fundoscopic exam 2nd, 3rd, 4th, 6th - pupils equal and reactive to light, visual fields full to confrontation, extraocular muscles intact, no nystagmus 5th - facial sensation symmetric 7th - facial strength symmetric 8th  - hearing intact 9th - palate elevates symmetrically, uvula midline 11th - shoulder shrug symmetric 12th - tongue protrusion midline  MOTOR:  normal bulk and tone, full strength in the BUE, BLE  SENSORY:  normal and symmetric to light touch, pinprick, temperature, vibration; EXCEPT DECR IN FEET / TOES TO PP AND TEMP / VIB  COORDINATION:  finger-nose-finger, fine finger movements normal  REFLEXES:  deep tendon reflexes 2+ and symmetric  GAIT/STATION:  narrow based gait     DIAGNOSTIC DATA (LABS, IMAGING, TESTING) - I reviewed patient records, labs, notes, testing and imaging myself where available.  No results found for: "WBC", "HGB", "HCT", "MCV", "PLT"    Component Value Date/Time   PROT 6.4 06/19/2021 0816   ALBUMIN 4.3 06/19/2021 0816   AST 24 08/17/2021 0923   ALT 13 08/17/2021 0923   ALKPHOS 90 06/19/2021 0816   BILITOT 0.5 06/19/2021 0816   Lab Results  Component Value Date   CHOL 222 (H) 05/10/2022   HDL 65 05/10/2022   LDLCALC 144 (H) 05/10/2022   TRIG 73 05/10/2022   CHOLHDL 3.4 05/10/2022   No results found for: "HGBA1C" No results found for: "VITAMINB12" No results found for: "TSH"   B12 - 287  2015 MRI lumbar spine 1. Mild grade 1 anterolisthesis at L4-L5 with advanced disc and  posterior element degeneration. 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  2. Chronic disc and endplate degeneration at L5-S1 primarily  responsible for borderline to mild left lateral recess stenosis and  moderate L5 biforaminal stenosis.   04/17/22 MRI brain Mild chronic microangiopathy.  04/23/22 EMG/NCS - mild sural neuropathies; otherwise negative    ASSESSMENT AND PLAN  76 y.o. year old female here with:   Dx:  1. Numbness in feet   2. Memory loss   3. Malaise     PLAN:  MEMORY LOSS / FATIGUE / MALAISE (post-covid, stress, insomnia) - consider sleep study -  optimize nutrition, sleep, exercise, stress mgmt  NUMBNESS IN FEET (could be mild idiopathic sensory neuropathy; also severe spinal stenosis) - continue B12 replacement; no pain fortunately, so no role for neuropathic pain meds  Return for return to PCP, pending  if symptoms worsen or fail to improve.    Penni Bombard, MD 02/14/3709, 62:69 AM Certified in Neurology, Neurophysiology and Neuroimaging  [facility] [address] [place], [place] [address] [phone]

## 2022-06-08 ENCOUNTER — Encounter: Payer: Self-pay | Admitting: Diagnostic Neuroimaging

## 2022-06-18 ENCOUNTER — Encounter: Payer: Self-pay | Admitting: Neurology

## 2022-06-27 ENCOUNTER — Ambulatory Visit: Payer: PPO

## 2022-07-25 ENCOUNTER — Ambulatory Visit
Admission: RE | Admit: 2022-07-25 | Discharge: 2022-07-25 | Disposition: A | Discharge status: Home / Self Care | Payer: PPO | Source: Ambulatory Visit | Attending: Internal Medicine | Admitting: Internal Medicine

## 2022-07-25 DIAGNOSIS — Z1231 Encounter for screening mammogram for malignant neoplasm of breast: Secondary | ICD-10-CM

## 2022-08-30 DIAGNOSIS — M48062 Spinal stenosis, lumbar region with neurogenic claudication: Secondary | ICD-10-CM | POA: Insufficient documentation

## 2022-08-30 DIAGNOSIS — R053 Chronic cough: Secondary | ICD-10-CM | POA: Insufficient documentation

## 2022-08-31 ENCOUNTER — Ambulatory Visit: Payer: PPO | Admitting: Neurology

## 2022-08-31 ENCOUNTER — Encounter: Payer: Self-pay | Admitting: Neurology

## 2022-08-31 VITALS — BP 150/81 | HR 87 | Ht 65.5 in | Wt 164.8 lb

## 2022-08-31 DIAGNOSIS — G629 Polyneuropathy, unspecified: Secondary | ICD-10-CM | POA: Diagnosis not present

## 2022-08-31 NOTE — Progress Notes (Signed)
[facility] Clinic Note - Initial Visit   Date: 08/31/2022   Deanna Walsh MRN: [id number] DOB: 06-Nov-1945   Dear Dr. Arnoldo Morale:  Thank you for your kind referral of Deanna Walsh for consultation of neuropathy. Although her history is well known to you, please allow [place] to reiterate it for the purpose of our medical record. The patient was accompanied to the clinic by self.    Deanna Walsh is a 76 y.o. female with breast cancer s/p lumpectomy and radiation (1999), COPD, hypertension, hyperlipidemia, and MVP presenting for evaluation of neuropathy.   IMPRESSION/PLAN: Idiopathic peripheral neuropathy manifesting with numbness in the feet.  She had many questions regarding the diagnosis. I had extensive discussion with the patient regarding the pathogenesis, etiology, management, and natural course of neuropathy. Neuropathy tends to be slowly progressive and management is symptomatic.    - Encouraged balance exercises  - No medication can treat numbness  Lumbar spondylosis.  No radicular symptoms at this time and less likely to contribute to her distal paresthesias.  Return to clinic as needed  ------------------------------------------------------------- History of present illness: She reports having numbness of the heels about 20 years ago.  About a year ago, she began having numbness between her toes, heels, and reduced sensation in the arch.  She feels like she is wearing socks.  Sometimes, she has cold sensation in the feet and was wearing toe warmers.  She had NCS/EMG in August 2023 which showed bilateral sural neuropathies.  She saw Dr. Leta Baptist in September to discuss these results and is here for second opinion.  No personal history of diabetes, alcoholism, or exposure to chemotherapy.  No weakness.  Balance is good.   Past Medical History:  Diagnosis Date   Abnormal CXR 10/06/2018   Formatting of this note might be different from the  original. She had abnormal CXR that showed COPD but patient does not think she does though she has had second hand smoke exposure, discussed with her recommendation for PFTs she wants to wait on this   Cancer [facility])    Breast Ca   Chronic constipation 11/30/2019   Collagen vascular disease ([place])    COPD suggested by initial evaluation ([place]) 06/22/2019   Dyslipidemia 05/08/2016   Dyspnea on exertion 03/10/2020   Elevated blood-pressure reading without diagnosis of hypertension 10/06/2018   History of breast cancer 12/31/2019   Hypertension    Malaise and fatigue 01/20/2016   Last Assessment & Plan:  Formatting of this note might be different from the original. Relevant Hx: Course: Daily Update: Today's Plan:update her labs for her and discussed with her energy levels and her sleep which is stable overall  Electronically signed by: Mayer Camel, NP 01/23/16 [phone]   Mitral valve prolapse 01/20/2016   Last Assessment & Plan:  Relevant Hx: Course: Daily Update: Today's Plan:she follows with Dr. Agustin Cree for her MVP and her [place] was done about year ago and it was stable for her  Electronically signed by: Mayer Camel, NP 01/23/16 0835   Mixed hyperlipidemia 01/20/2016   Last Assessment & Plan:  Relevant Hx: Course: Daily Update: Today's Plan:she is fasting for this and has been trying to watch her diet  Electronically signed by: Mayer Camel, NP 01/23/16 0832   Neuropathy    Osteopenia 01/20/2016   Last Assessment & Plan:  Formatting of this note might be different from the original. Relevant Hx: Course: Daily Update: Today's Plan:she is taking Bone Strength for her  bones and she still follows with her GYN for her womens health and it was this year and she was advised fosamax but refuses, Dr. Corinna Capra is her provider.  Electronically signed by: Mayer Camel, NP 01/23/16 0831   Personal history of radiation therapy    Primary osteoarthritis involving  multiple joints 01/23/2016   Last Assessment & Plan:  Formatting of this note might be different from the original. Relevant Hx: Course: Daily Update: Today's Plan:she uses the ibuprofen and is careful with this and would like to have the prescription strength version of this as is easier.  Electronically signed by: Mayer Camel, NP 01/23/16 [phone]   Thyroid nodule 07/01/2019    Past Surgical History:  Procedure Laterality Date   BREAST LUMPECTOMY Left 1999   CARPAL TUNNEL RELEASE     CATARACT EXTRACTION Bilateral    CHOLECYSTECTOMY     COLONOSCOPY     TONSILLECTOMY  1959     Medications:  Outpatient Encounter Medications as of 08/31/2022  Medication Sig Note   amLODipine (NORVASC) 5 MG tablet Take 2.5 mg by mouth.    Aspirin-Acetaminophen-Caffeine (EXCEDRIN PO) Take 200 mg by mouth as needed (Headaches).    calcium citrate (CALCITRATE - DOSED IN MG ELEMENTAL CALCIUM) 950 (200 Ca) MG tablet Take 200 mg of elemental calcium by mouth 2 (two) times daily.    latanoprost (XALATAN) 0.005 % ophthalmic solution Place 1 drop into both eyes at bedtime.    Methylsulfonylmethane (MSM PO) Take 1,000 mg by mouth at bedtime.    Multiple Vitamins-Minerals (PRESERVISION AREDS PO) Take 1 tablet by mouth daily.    OVER THE COUNTER MEDICATION Take 1,000 mg by mouth daily. 1000-'400mg'$  Gust Rung Based calcium and citrate    polyethylene glycol (MIRALAX / GLYCOLAX) 17 g packet Take 17 g by mouth daily.    [DISCONTINUED] amLODipine (NORVASC) 5 MG tablet Take 1 tablet (5 mg total) by mouth daily. 08/31/2022: Taking 2.'5mg'$  once a day    BLACK COHOSH EXTRACT PO Take 100 mg by mouth daily. (Patient not taking: Reported on 08/31/2022)    pravastatin (PRAVACHOL) 40 MG tablet TAKE 1 TABLET(40 MG) BY MOUTH EVERY EVENING (Patient not taking: Reported on 06/06/2022)    [DISCONTINUED] ibuprofen (ADVIL) 800 MG tablet Take 800 mg by mouth every 8 (eight) hours as needed. (Patient not taking: Reported on 08/31/2022)     [DISCONTINUED] senna (SENOKOT) 8.6 MG tablet Take 1 tablet by mouth at bedtime. (Patient not taking: Reported on 06/06/2022)    No facility-administered encounter medications on file as of 08/31/2022.    Allergies: No Known Allergies  Family History: Family History  Problem Relation Age of Onset   Cervical cancer Mother    Diabetes Father    Hypertension Father    Colon cancer Neg Hx    Esophageal cancer Neg Hx    Rectal cancer Neg Hx    Stomach cancer Neg Hx    Breast cancer Neg Hx     Social History: Social History   Tobacco Use   Smoking status: Never   Smokeless tobacco: Never  Vaping Use   Vaping Use: Never used  Substance Use Topics   Alcohol use: Not Currently   Drug use: Never   Social History   Social History Narrative   Not on file    Vital Signs:  BP (!) 150/81   Pulse 87   Ht 5' 5.5" (1.664 m)   Wt 164 lb 12.8 oz (74.8 kg)   SpO2 96%  BMI 27.01 kg/m    Neurological Exam: MENTAL STATUS including orientation to time, place, person, recent and remote memory, attention span and concentration, language, and fund of knowledge is normal.  Speech is not dysarthric.  CRANIAL NERVES: II:  No visual field defects.    III-IV-VI: Pupils equal round and reactive to light.  Normal conjugate, extra-ocular eye movements in all directions of gaze.  No nystagmus.  No ptosis.   V:  Normal facial sensation.    VII:  Normal facial symmetry and movements.   VIII:  Normal hearing and vestibular function.   IX-X:  Normal palatal movement.   XI:  Normal shoulder shrug and head rotation.   XII:  Normal tongue strength and range of motion, no deviation or fasciculation.  MOTOR:  Motor strength is 5/5 throughout, including distally.  No atrophy, fasciculations or abnormal movements.  No pronator drift.   MSRs:                                           Right        Left brachioradialis 2+  2+  biceps 2+  2+  triceps 2+  2+  patellar 3+  3+  ankle jerk 2+  2+   Hoffman no  no  plantar response down  down   SENSORY:  Reduced vibration at the great toe bilaterally, intact above the ankles.  Pin prick is mildly reduced in the feet.  Temperature intact.  Romberg's sign absent.   COORDINATION/GAIT: Normal finger-to- nose-finger.  Intact rapid alternating movements bilaterally.  Gait narrow based and stable. Stressed gait is intact.  Mild unsteadiness with tandem gait.   Total time spent reviewing records, interview, history/exam, documentation, and coordination of care on day of encounter:  45 min    Thank you for allowing me to participate in patient's care.  If I can answer any additional questions, I would be pleased to do so.    Sincerely,    Latausha Flamm K. Posey Pronto, DO

## 2022-09-27 ENCOUNTER — Other Ambulatory Visit (INDEPENDENT_AMBULATORY_CARE_PROVIDER_SITE_OTHER): Payer: PPO

## 2022-09-27 ENCOUNTER — Ambulatory Visit (INDEPENDENT_AMBULATORY_CARE_PROVIDER_SITE_OTHER): Payer: PPO | Admitting: Gastroenterology

## 2022-09-27 ENCOUNTER — Encounter: Payer: Self-pay | Admitting: Gastroenterology

## 2022-09-27 VITALS — BP 132/80 | HR 81 | Ht 65.0 in | Wt 164.0 lb

## 2022-09-27 DIAGNOSIS — R140 Abdominal distension (gaseous): Secondary | ICD-10-CM

## 2022-09-27 DIAGNOSIS — K5909 Other constipation: Secondary | ICD-10-CM

## 2022-09-27 DIAGNOSIS — R1032 Left lower quadrant pain: Secondary | ICD-10-CM | POA: Diagnosis not present

## 2022-09-27 LAB — BASIC METABOLIC PANEL
BUN: 14 mg/dL (ref 6–23)
CO2: 32 mEq/L (ref 19–32)
Calcium: 9.4 mg/dL (ref 8.4–10.5)
Chloride: 102 mEq/L (ref 96–112)
Creatinine, Ser: 0.76 mg/dL (ref 0.40–1.20)
GFR: 76.71 mL/min (ref >60.00)
Glucose, Bld: 97 mg/dL (ref 70–99)
Potassium: 4.1 mEq/L (ref 3.5–5.1)
Sodium: 141 mEq/L (ref 135–145)

## 2022-09-27 NOTE — Progress Notes (Signed)
Chief Complaint: For GI eval  Referring Provider:  Raina Mina., MD      ASSESSMENT AND PLAN;   #1. LLQ pain with bloating.  Negative colonoscopy 01/2022 except for small tubular adenoma, mild sigmoid diverticulosis.  #2. Chronic constipation/ IBS-C with bloating. Nl TSH. Neg colon 01/2022  Plan: -CT AP with contrast. Check BMP  -Miralax 17g po QD with 8OZ water to continue -FU thereafter -Hold off on empiric antibiotics for now.   HPI:    Deanna Walsh is a 77 y.o. female   For follow-up. C/O lower abdominal pain-more on the left side, gets worse with bending legs and walking.  Not always relieved by BMs.  She does have some associated back pain. Does complain of abdominal bloating Longstanding constipation-has been taking MiraLAX 17 g p.o. daily with some relief.  Currently having BMs every other day. No fever or chills  Seen by [facility].  Had normal CBC, CMP, TSH. Advised GI follow-up.  No weight loss.  Denies having any nausea or vomiting.  No sodas, chocolates, chewing gums, artificial sweeteners and candy. No NSAIDs.   No history suggestive of lactose or gluten intolerance  Past GI procedures:  Colon 02/07/2022: Small colon polyp s/p polypectomy, mild sigmoid diverticulosis.  Bx- tubular adenoma.  No need to repeat due to age.   Colonoscopy 10/2016 ([facility]): Colonic polyp s/p polypectomy, mild sigmoid diverticulosis.   Past Medical History:  Diagnosis Date   Abnormal CXR 10/06/2018   Formatting of this note might be different from the original. She had abnormal CXR that showed COPD but patient does not think she does though she has had second hand smoke exposure, discussed with her recommendation for PFTs she wants to wait on this   Cancer [facility])    Breast Ca   Chronic constipation 11/30/2019   Collagen vascular disease ([place])    COPD suggested by initial evaluation ([place]) 06/22/2019   Dyslipidemia 05/08/2016   Dyspnea on exertion 03/10/2020   Elevated  blood-pressure reading without diagnosis of hypertension 10/06/2018   History of breast cancer 12/31/2019   Hypertension    Malaise and fatigue 01/20/2016   Last Assessment & Plan:  Formatting of this note might be different from the original. Relevant Hx: Course: Daily Update: Today's Plan:update her labs for her and discussed with her energy levels and her sleep which is stable overall  Electronically signed by: Mayer Camel, NP 01/23/16 [phone]   Mitral valve prolapse 01/20/2016   Last Assessment & Plan:  Relevant Hx: Course: Daily Update: Today's Plan:she follows with Dr. Agustin Cree for her MVP and her [place] was done about year ago and it was stable for her  Electronically signed by: Mayer Camel, NP 01/23/16 0835   Mixed hyperlipidemia 01/20/2016   Last Assessment & Plan:  Relevant Hx: Course: Daily Update: Today's Plan:she is fasting for this and has been trying to watch her diet  Electronically signed by: Mayer Camel, NP 01/23/16 0832   Neuropathy    Osteopenia 01/20/2016   Last Assessment & Plan:  Formatting of this note might be different from the original. Relevant Hx: Course: Daily Update: Today's Plan:she is taking Bone Strength for her bones and she still follows with her GYN for her womens health and it was this year and she was advised fosamax but refuses, Dr. Corinna Capra is her provider.  Electronically signed by: Mayer Camel, NP 01/23/16 0831   Personal history of radiation therapy    Primary osteoarthritis  involving multiple joints 01/23/2016   Last Assessment & Plan:  Formatting of this note might be different from the original. Relevant Hx: Course: Daily Update: Today's Plan:she uses the ibuprofen and is careful with this and would like to have the prescription strength version of this as is easier.  Electronically signed by: Mayer Camel, NP 01/23/16 [phone]   Thyroid nodule 07/01/2019    Past Surgical History:  Procedure  Laterality Date   BREAST LUMPECTOMY Left 1999   CARPAL TUNNEL RELEASE     CATARACT EXTRACTION Bilateral    CHOLECYSTECTOMY     COLONOSCOPY     TONSILLECTOMY  1959    Family History  Problem Relation Age of Onset   Cervical cancer Mother    Diabetes Father    Hypertension Father    Colon cancer Neg Hx    Esophageal cancer Neg Hx    Rectal cancer Neg Hx    Stomach cancer Neg Hx    Breast cancer Neg Hx     Social History   Tobacco Use   Smoking status: Never   Smokeless tobacco: Never  Vaping Use   Vaping Use: Never used  Substance Use Topics   Alcohol use: Not Currently   Drug use: Never    Current Outpatient Medications  Medication Sig Dispense Refill   amLODipine (NORVASC) 5 MG tablet Take 2.5 mg by mouth.     latanoprost (XALATAN) 0.005 % ophthalmic solution Place 1 drop into both eyes at bedtime.     Methylsulfonylmethane (MSM PO) Take 1,000 mg by mouth at bedtime.     Multiple Vitamins-Minerals (PRESERVISION AREDS PO) Take 1 tablet by mouth daily.     NON FORMULARY Citra cal 400 mg     NON FORMULARY Calcium algae base 650 mg     OVER THE COUNTER MEDICATION Take 1,000 mg by mouth daily. 1000-'400mg'$  [place] Based calcium and citrate     polyethylene glycol (MIRALAX / GLYCOLAX) 17 g packet Take 17 g by mouth daily. 1 capful in the morning     Aspirin-Acetaminophen-Caffeine (EXCEDRIN PO) Take 200 mg by mouth as needed (Headaches). (Patient not taking: Reported on 09/27/2022)     BLACK COHOSH EXTRACT PO Take 100 mg by mouth daily. (Patient not taking: Reported on 08/31/2022)     calcium citrate (CALCITRATE - DOSED IN MG ELEMENTAL CALCIUM) 950 (200 Ca) MG tablet Take 200 mg of elemental calcium by mouth 2 (two) times daily. (Patient not taking: Reported on 09/27/2022)     pravastatin (PRAVACHOL) 40 MG tablet TAKE 1 TABLET(40 MG) BY MOUTH EVERY EVENING (Patient not taking: Reported on 06/06/2022) 90 tablet 3   No current facility-administered medications for this visit.     No Known Allergies  Review of Systems:  Constitutional: Denies fever, chills, diaphoresis, appetite change and fatigue.  HEENT: Denies photophobia, eye pain, redness, hearing loss, ear pain, congestion, sore throat, rhinorrhea, sneezing, mouth sores, neck pain, neck stiffness and tinnitus.   Respiratory: Denies SOB, DOE, cough, chest tightness,  and wheezing.   Cardiovascular: Denies chest pain, palpitations and leg swelling.  Genitourinary: Denies dysuria, urgency, frequency, hematuria, flank pain and difficulty urinating.  Musculoskeletal: Denies myalgias, back pain, joint swelling, arthralgias and gait problem.  Skin: No rash.  Neurological: Denies dizziness, seizures, syncope, weakness, light-headedness, numbness and headaches.  Hematological: Denies adenopathy. Easy bruising, personal or family bleeding history  Psychiatric/Behavioral: No anxiety or depression     Physical Exam:    Ht '5\' 5"'$  (1.651 m)  Wt 164 lb (74.4 kg)   BMI 27.29 kg/m  Wt Readings from Last 3 Encounters:  09/27/22 164 lb (74.4 kg)  08/31/22 164 lb 12.8 oz (74.8 kg)  06/06/22 164 lb (74.4 kg)   Constitutional:  Well-developed, in no acute distress. Psychiatric: Normal mood and affect. Behavior is normal. HEENT: Pupils normal.  Conjunctivae are normal. No scleral icterus. Cardiovascular: Normal rate, regular rhythm. No edema Pulmonary/chest: Effort normal and breath sounds normal. No wheezing, rales or rhonchi. Abdominal: Soft, nondistended. Bowel sounds active throughout. There are no masses palpable. No hepatomegaly.  Mild left lower quadrant abdominal tenderness without rebound.  She also has some back tenderness. Rectal: Deferred Neurological: Alert and oriented to person place and time. Skin: Skin is warm and dry. No rashes noted.     Carmell Austria, MD 09/27/2022, 11:15 AM  Cc: Raina Mina., MD

## 2022-09-27 NOTE — Patient Instructions (Signed)
_______________________________________________________  If your blood pressure at your visit was 140/90 or greater, please contact your primary care physician to follow up on this.  _______________________________________________________  If you are age 76 or older, your body mass index should be between 23-30. Your Body mass index is 27.29 kg/m. If this is out of the aforementioned range listed, please consider follow up with your Primary Care Provider.  If you are age 39 or younger, your body mass index should be between 19-25. Your Body mass index is 27.29 kg/m. If this is out of the aformentioned range listed, please consider follow up with your Primary Care Provider.   ________________________________________________________   The [facility] providers would like to encourage you to use [facility] to communicate with providers for non-urgent requests or questions.  Due to long hold times on the telephone, sending your provider a message by [facility] may be a faster and more efficient way to get a response.  Please allow 48 business hours for a response.  Please remember that this is for non-urgent requests.  _______________________________________________________   Your provider has requested that you go to the basement level for lab work before leaving today. Press "B" on the elevator. The lab is located at the first door on the left as you exit the elevator.  Please purchase the following medications over the counter and take as directed: Miralax 17g daily on 8oz water   You have been scheduled for a CT [facility] the abdomen and pelvis [facility] [place], [place] [address]).   You are scheduled on 10-08-2022 at 430pm. You should arrive at  2 pm  prior to your appointment time for registration. Please follow the written instructions below on the day of your exam:  WARNING: IF YOU ARE ALLERGIC TO IODINE/X-RAY DYE, PLEASE NOTIFY RADIOLOGY IMMEDIATELY AT  [phone]! YOU WILL BE GIVEN A 13 HOUR PREMEDICATION PREP.  1) Do not eat or drink anything after 12pm  (4 hours prior to your test) 2) You have been given 2 bottles of oral contrast to drink. The solution may taste better if refrigerated, but do NOT add ice or any other liquid to this solution. Shake well before drinking.    Drink 1 bottle of contrast @ 230pm (2 hours prior to your exam)  Drink 1 bottle of contrast @ 330pm (1 hour prior to your exam)  You may take any medications as prescribed with a small amount of water, if necessary. If you take any of the following medications: METFORMIN, GLUCOPHAGE, GLUCOVANCE, AVANDAMET, RIOMET, FORTAMET, [place] MET, JANUMET, GLUMETZA or METAGLIP, you MAY be asked to HOLD this medication 48 hours AFTER the exam.  The purpose of you drinking the oral contrast is to aid in the visualization of your intestinal tract. The contrast solution may cause some diarrhea. Depending on your individual set of symptoms, you may also receive an intravenous injection of x-ray contrast/dye. Plan on being at [facility] for 30 minutes or longer, depending on the type of exam you are having performed.  This test typically takes 30-45 minutes to complete.  If you have any questions regarding your exam or if you need to reschedule, you may call the CT department at [phone] between the hours of 8:00 am and 5:00 pm, Monday-Friday.  ________________________________________________________________________  Thank you,  Dr. Jackquline Denmark

## 2022-10-08 ENCOUNTER — Other Ambulatory Visit (HOSPITAL_COMMUNITY): Payer: PPO

## 2022-10-10 ENCOUNTER — Telehealth: Payer: Self-pay | Admitting: Gastroenterology

## 2022-10-10 NOTE — Telephone Encounter (Signed)
Inbound call from patient requesting labs results from 1/18. Please advise.

## 2022-10-11 ENCOUNTER — Ambulatory Visit (HOSPITAL_COMMUNITY)
Admission: RE | Admit: 2022-10-11 | Discharge: 2022-10-11 | Disposition: A | Discharge status: Home / Self Care | Payer: PPO | Source: Ambulatory Visit | Attending: Gastroenterology | Admitting: Gastroenterology

## 2022-10-11 DIAGNOSIS — K5909 Other constipation: Secondary | ICD-10-CM | POA: Diagnosis present

## 2022-10-11 DIAGNOSIS — R1032 Left lower quadrant pain: Secondary | ICD-10-CM | POA: Diagnosis present

## 2022-10-11 DIAGNOSIS — R140 Abdominal distension (gaseous): Secondary | ICD-10-CM

## 2022-10-11 MED ORDER — IOHEXOL 300 MG/ML  SOLN
100.0000 mL | Freq: Once | INTRAMUSCULAR | Status: AC | PRN
  Administered 2022-10-11: 100 mL via INTRAVENOUS

## 2022-10-11 MED ORDER — IOHEXOL 9 MG/ML PO SOLN
500.0000 mL | ORAL | Status: AC
  Administered 2022-10-11: 500 mL via ORAL

## 2022-10-11 MED ORDER — IOHEXOL 9 MG/ML PO SOLN
ORAL | Status: AC
  Filled 2022-10-11: qty 1000

## 2022-10-11 NOTE — Telephone Encounter (Signed)
Left message for pt to call back  °

## 2022-10-11 NOTE — Telephone Encounter (Signed)
Pt stated that she was about to have a CT scan done today and questioned her labs that were done to show her Kidney function: Pt notified that the labs were normal. Pt requested a copy of the lab.  Copy sent via mail.  Pt verbalized understanding with all questions answered.

## 2022-10-22 NOTE — Telephone Encounter (Signed)
Inbound call from patient requesting a call back to discuss some questions that she has about her C scan she had on 2/1. Please advise.

## 2022-10-22 NOTE — Telephone Encounter (Signed)
Pt questioned the results of the CT scan. Chart reviewed. Noted in Epic that results have already been discussed with the Pt. Results reviewed with pt once more. Pt stated that she is wondering why she is still having the feeling of fullness when she eats only a little and feels bloated most of the time: Please advise:

## 2022-11-13 ENCOUNTER — Ambulatory Visit: Payer: PPO | Admitting: Cardiology

## 2023-01-21 ENCOUNTER — Other Ambulatory Visit: Payer: Self-pay | Admitting: Cardiology

## 2023-01-24 ENCOUNTER — Encounter: Payer: Self-pay | Admitting: Cardiology

## 2023-01-24 ENCOUNTER — Ambulatory Visit: Payer: PPO | Attending: Cardiology | Admitting: Cardiology

## 2023-01-24 ENCOUNTER — Other Ambulatory Visit: Payer: Self-pay | Admitting: Cardiology

## 2023-01-24 VITALS — BP 112/68 | HR 83 | Ht 65.5 in | Wt 160.6 lb

## 2023-01-24 DIAGNOSIS — R0609 Other forms of dyspnea: Secondary | ICD-10-CM | POA: Diagnosis not present

## 2023-01-24 DIAGNOSIS — E785 Hyperlipidemia, unspecified: Secondary | ICD-10-CM

## 2023-01-24 DIAGNOSIS — I341 Nonrheumatic mitral (valve) prolapse: Secondary | ICD-10-CM | POA: Diagnosis not present

## 2023-01-24 MED ORDER — AMLODIPINE BESYLATE 5 MG PO TABS: 5.0000 mg | ORAL_TABLET | Freq: Every day | ORAL | 3 refills | Status: AC

## 2023-01-24 NOTE — Progress Notes (Signed)
Cardiology Office Note:    Date:  01/24/2023   ID:  Deanna Walsh, DOB December 10, 1945, MRN [id number]  PCP:  Gordan Payment., MD  Cardiologist:  Gypsy Balsam, MD    Referring MD: Gordan Payment., MD   Chief Complaint  Patient presents with   Follow-up    History of Present Illness:    Deanna Walsh is a 77 y.o. female with past medical history significant for essential hypertension, dyslipidemia, mitral regurgitation but last echocardiogram did not show significant.  Comes today to my office for follow-up overall she states she is doing better she said she got more energy she gets less shortness of breath overall seems to be improving.  Denies have any chest pain tightness squeezing pressure burning chest.  Past Medical History:  Diagnosis Date   Abnormal CXR 10/06/2018   Formatting of this note might be different from the original. She had abnormal CXR that showed COPD but patient does not think she does though she has had second hand smoke exposure, discussed with her recommendation for PFTs she wants to wait on this   Cancer [facility])    Breast Ca   Chronic constipation 11/30/2019   Collagen vascular disease ([facility])    COPD suggested by initial evaluation ([facility]) 06/22/2019   Dyslipidemia 05/08/2016   Dyspnea on exertion 03/10/2020   Elevated blood-pressure reading without diagnosis of hypertension 10/06/2018   History of breast cancer 12/31/2019   Hypertension    Malaise and fatigue 01/20/2016   Last Assessment & Plan:  Formatting of this note might be different from the original. Relevant Hx: Course: Daily Update: Today's Plan:update her labs for her and discussed with her energy levels and her sleep which is stable overall  Electronically signed by: Krystal Clark, NP 01/23/16 [phone]   Mitral valve prolapse 01/20/2016   Last Assessment & Plan:  Relevant Hx: Course: Daily Update: Today's Plan:she follows with Dr. Bing Matter for her MVP and her [place] was done about year  ago and it was stable for her  Electronically signed by: Krystal Clark, NP 01/23/16 0835   Mixed hyperlipidemia 01/20/2016   Last Assessment & Plan:  Relevant Hx: Course: Daily Update: Today's Plan:she is fasting for this and has been trying to watch her diet  Electronically signed by: Krystal Clark, NP 01/23/16 0832   Neuropathy    Osteopenia 01/20/2016   Last Assessment & Plan:  Formatting of this note might be different from the original. Relevant Hx: Course: Daily Update: Today's Plan:she is taking Bone Strength for her bones and she still follows with her GYN for her womens health and it was this year and she was advised fosamax but refuses, Dr. Rana Snare is her provider.  Electronically signed by: Krystal Clark, NP 01/23/16 0831   Personal history of radiation therapy    Primary osteoarthritis involving multiple joints 01/23/2016   Last Assessment & Plan:  Formatting of this note might be different from the original. Relevant Hx: Course: Daily Update: Today's Plan:she uses the ibuprofen and is careful with this and would like to have the prescription strength version of this as is easier.  Electronically signed by: Krystal Clark, NP 01/23/16 [phone]   Thyroid nodule 07/01/2019    Past Surgical History:  Procedure Laterality Date   BREAST LUMPECTOMY Left 1999   CARPAL TUNNEL RELEASE     CATARACT EXTRACTION Bilateral    CHOLECYSTECTOMY     COLONOSCOPY     TONSILLECTOMY  1959  Current Medications: Current Meds  Medication Sig   amLODipine (NORVASC) 5 MG tablet Take 5 mg by mouth daily.   BLACK COHOSH EXTRACT PO Take 100 mg by mouth daily.   calcium citrate (CALCITRATE - DOSED IN MG ELEMENTAL CALCIUM) 950 (200 Ca) MG tablet Take 200 mg of elemental calcium by mouth 2 (two) times daily.   ibuprofen (ADVIL) 200 MG tablet Take 800 mg by mouth every 6 (six) hours as needed for mild pain or moderate pain.   latanoprost (XALATAN) 0.005 %  ophthalmic solution Place 1 drop into both eyes at bedtime.   Methylsulfonylmethane (MSM PO) Take 1,000 mg by mouth at bedtime.   Multiple Vitamins-Minerals (PRESERVISION AREDS PO) Take 1 tablet by mouth daily.   NON FORMULARY Take 1 tablet by mouth daily. Citra cal 400 mg   NON FORMULARY Take 1 tablet by mouth daily. Calcium algae base 650 mg   OVER THE COUNTER MEDICATION Take 1,000 mg by mouth daily. 1000-400mg  [place] Based calcium and citrate   polyethylene glycol (MIRALAX / GLYCOLAX) 17 g packet Take 17 g by mouth daily. 1 capful in the morning   [DISCONTINUED] Aspirin-Acetaminophen-Caffeine (EXCEDRIN PO) Take 200 mg by mouth as needed (Headaches).   [DISCONTINUED] pravastatin (PRAVACHOL) 40 MG tablet TAKE 1 TABLET(40 MG) BY MOUTH EVERY EVENING (Patient taking differently: Take 40 mg by mouth daily.)     Allergies:   Pravastatin   Social History   Socioeconomic History   Marital status: Married    Spouse name: Not on file   Number of children: Not on file   Years of education: Not on file   Highest education level: Not on file  Occupational History   Not on file  Tobacco Use   Smoking status: Never   Smokeless tobacco: Never  Vaping Use   Vaping Use: Never used  Substance and Sexual Activity   Alcohol use: Not Currently   Drug use: Never   Sexual activity: Not on file  Other Topics Concern   Not on file  Social History Narrative   Not on file   Social Determinants of Health   Financial Resource Strain: Not on file  Food Insecurity: Not on file  Transportation Needs: Not on file  Physical Activity: Not on file  Stress: Not on file  Social Connections: Not on file     Family History: The patient's family history includes Cervical cancer in her mother; Diabetes in her father; Hypertension in her father. There is no history of Colon cancer, Esophageal cancer, Rectal cancer, Stomach cancer, or Breast cancer. ROS:   Please see the history of present illness.    All 14  point review of systems negative except as described per history of present illness  EKGs/Labs/Other Studies Reviewed:      Recent Labs: 09/27/2022: BUN 14; Creatinine, Ser 0.76; Potassium 4.1; Sodium 141  Recent Lipid Panel    Component Value Date/Time   CHOL 222 (H) 05/10/2022 0816   TRIG 73 05/10/2022 0816   HDL 65 05/10/2022 0816   CHOLHDL 3.4 05/10/2022 0816   LDLCALC 144 (H) 05/10/2022 0816    Physical Exam:    VS:  BP 112/68 (BP Location: Left Arm, Patient Position: Sitting)   Pulse 83   Ht 5' 5.5" (1.664 m)   Wt 160 lb 9.6 oz (72.8 kg)   SpO2 96%   BMI 26.32 kg/m     Wt Readings from Last 3 Encounters:  01/24/23 160 lb 9.6 oz (72.8 kg)  09/27/22  164 lb (74.4 kg)  08/31/22 164 lb 12.8 oz (74.8 kg)     GEN:  Well nourished, well developed in no acute distress HEENT: Normal NECK: No JVD; No carotid bruits LYMPHATICS: No lymphadenopathy CARDIAC: RRR, no murmurs, no rubs, no gallops RESPIRATORY:  Clear to auscultation without rales, wheezing or rhonchi  ABDOMEN: Soft, non-tender, non-distended MUSCULOSKELETAL:  No edema; No deformity  SKIN: Warm and dry LOWER EXTREMITIES: no swelling NEUROLOGIC:  Alert and oriented x 3 PSYCHIATRIC:  Normal affect   ASSESSMENT:    1. Mitral valve prolapse   2. Dyslipidemia   3. Dyspnea on exertion    PLAN:    In order of problems listed above:  Mitral valve prolapse last echocardiogram done to confirm that.  Will continue monitoring. Dyslipidemia I did review her K PN LDL is 144 HDL 66 however this is from August of last year.  Previously we tried statin however she convinced that did give her neuropathy and she does not want to try statin again but will recheck fasting lipid profile. Dyspnea on exertion denies having any doing well from that point review   Medication Adjustments/Labs and Tests Ordered: Current medicines are reviewed at length with the patient today.  Concerns regarding medicines are outlined above.   Orders Placed This Encounter  Procedures   Lipid Profile   Medication changes: No orders of the defined types were placed in this encounter.   Signed, Georgeanna Lea, MD, [facility] 01/24/2023 4:58 PM    [facility]

## 2023-01-24 NOTE — Telephone Encounter (Signed)
*  STAT* If patient is at the pharmacy, call can be transferred to refill team.   1. Which medications need to be refilled? (please list name of each medication and dose if known)   amLODipine (NORVASC) 5 MG tablet    2. Which pharmacy/location (including street and city if local pharmacy) is medication to be sent to? [id number] - [place], [place] - [address]   3. Do they need a 30 day or 90 day supply? 90 day

## 2023-01-24 NOTE — Addendum Note (Signed)
Addended by: Roxanne Mins I on: 01/24/2023 05:10 PM   Modules accepted: Orders

## 2023-01-24 NOTE — Patient Instructions (Signed)
Medication Instructions:  Your physician recommends that you continue on your current medications as directed. Please refer to the Current Medication list given to you today.  *If you need a refill on your cardiac medications before your next appointment, please call your pharmacy*   Lab Work: None If you have labs (blood work) drawn today and your tests are completely normal, you will receive your results only by: MyChart Message (if you have MyChart) OR A paper copy in the mail If you have any lab test that is abnormal or we need to change your treatment, we will call you to review the results.   Testing/Procedures: None   Follow-Up: At [facility], you and your health needs are our priority.  As part of our continuing mission to provide you with exceptional heart care, we have created designated Provider Care Teams.  These Care Teams include your primary Cardiologist (physician) and Advanced Practice Providers (APPs -  Physician Assistants and Nurse Practitioners) who all work together to provide you with the care you need, when you need it.  We recommend signing up for the patient portal called "MyChart".  Sign up information is provided on this After Visit Summary.  MyChart is used to connect with patients for Virtual Visits (Telemedicine).  Patients are able to view lab/test results, encounter notes, upcoming appointments, etc.  Non-urgent messages can be sent to your provider as well.   To learn more about what you can do with MyChart, go to [email or web address].    Your next appointment:   6 month(s)  Provider:   Robert Krasowski, MD    Other Instructions None  

## 2023-01-26 LAB — LIPID PANEL
Chol/HDL Ratio: 3.2 ratio (ref 0.0–4.4)
Cholesterol, Total: 223 mg/dL — ABNORMAL HIGH (ref 100–199)
HDL: 69 mg/dL (ref >39)
LDL Chol Calc (NIH): 145 mg/dL — ABNORMAL HIGH (ref 0–99)
Triglycerides: 50 mg/dL (ref 0–149)
VLDL Cholesterol Cal: 9 mg/dL (ref 5–40)

## 2023-02-01 ENCOUNTER — Telehealth: Payer: Self-pay

## 2023-02-01 ENCOUNTER — Telehealth: Payer: Self-pay | Admitting: Cardiology

## 2023-02-01 DIAGNOSIS — E785 Hyperlipidemia, unspecified: Secondary | ICD-10-CM

## 2023-02-01 MED ORDER — EZETIMIBE 10 MG PO TABS: 10.0000 mg | ORAL_TABLET | Freq: Every day | ORAL | 3 refills | Status: DC

## 2023-02-01 NOTE — Telephone Encounter (Signed)
Results reviewed with pt as per Dr. Krasowski's note.  Pt verbalized understanding and had no additional questions. Routed to PCP  

## 2023-02-01 NOTE — Telephone Encounter (Signed)
Patient is requesting a call back to discuss lab results. 

## 2023-02-19 DIAGNOSIS — R1312 Dysphagia, oropharyngeal phase: Secondary | ICD-10-CM | POA: Insufficient documentation

## 2023-06-17 ENCOUNTER — Other Ambulatory Visit: Payer: Self-pay | Admitting: Obstetrics and Gynecology

## 2023-06-17 DIAGNOSIS — N644 Mastodynia: Secondary | ICD-10-CM

## 2023-06-28 ENCOUNTER — Ambulatory Visit
Admission: RE | Admit: 2023-06-28 | Discharge: 2023-06-28 | Disposition: A | Discharge status: Home / Self Care | Payer: PPO | Source: Ambulatory Visit | Attending: Obstetrics and Gynecology | Admitting: Obstetrics and Gynecology

## 2023-06-28 ENCOUNTER — Other Ambulatory Visit: Payer: Self-pay | Admitting: Obstetrics and Gynecology

## 2023-06-28 DIAGNOSIS — N644 Mastodynia: Secondary | ICD-10-CM

## 2023-07-01 ENCOUNTER — Other Ambulatory Visit: Payer: Self-pay | Admitting: Obstetrics and Gynecology

## 2023-07-01 DIAGNOSIS — Z1231 Encounter for screening mammogram for malignant neoplasm of breast: Secondary | ICD-10-CM

## 2023-07-03 ENCOUNTER — Other Ambulatory Visit: Payer: Self-pay | Admitting: Neurosurgery

## 2023-07-03 DIAGNOSIS — M4316 Spondylolisthesis, lumbar region: Secondary | ICD-10-CM

## 2023-07-11 ENCOUNTER — Other Ambulatory Visit: Payer: PPO

## 2023-07-23 ENCOUNTER — Ambulatory Visit
Admission: RE | Admit: 2023-07-23 | Discharge: 2023-07-23 | Disposition: A | Discharge status: Home / Self Care | Payer: PPO | Source: Ambulatory Visit | Attending: Neurosurgery | Admitting: Neurosurgery

## 2023-07-23 DIAGNOSIS — M4316 Spondylolisthesis, lumbar region: Secondary | ICD-10-CM

## 2023-07-31 ENCOUNTER — Ambulatory Visit
Admission: RE | Admit: 2023-07-31 | Discharge: 2023-07-31 | Disposition: A | Discharge status: Home / Self Care | Payer: PPO | Source: Ambulatory Visit | Attending: Obstetrics and Gynecology | Admitting: Obstetrics and Gynecology

## 2023-07-31 DIAGNOSIS — Z1231 Encounter for screening mammogram for malignant neoplasm of breast: Secondary | ICD-10-CM

## 2023-08-06 DIAGNOSIS — M5416 Radiculopathy, lumbar region: Secondary | ICD-10-CM | POA: Insufficient documentation

## 2023-08-07 ENCOUNTER — Telehealth: Payer: Self-pay

## 2023-08-07 NOTE — Telephone Encounter (Signed)
   Name: Deanna Walsh  DOB: 11-Aug-1946  MRN: [id number]  Primary Cardiologist: None   Preoperative team, please contact this patient and set up a phone call appointment for further preoperative risk assessment. Please obtain consent and complete medication review. Thank you for your help.  I confirm that guidance regarding antiplatelet and oral anticoagulation therapy has been completed and, if necessary, noted below.  None requested   I also confirmed the patient resides in the state of [place]. As per [facility] Medical Board telemedicine laws, the patient must reside in the state in which the provider is licensed.   Ronney Asters, NP 08/07/2023, 2:29 PM [facility]

## 2023-08-07 NOTE — Telephone Encounter (Signed)
   [facility] Pre-operative Risk Assessment    Request for surgical clearance:  What type of surgery is being performed? L4-5 Decompressive laminectomy instrumental with pedicle screws and rods, placement of interbody prothesis and posterior latera  arthrodesis.   When is this surgery scheduled?  TBD  What type of clearance is required (medical clearance vs. Pharmacy clearance to hold med vs. Both)? Both  Are there any medications that need to be held prior to surgery and how long?Not specified   Practice name and name of physician performing surgery? Dr. Tressie Stalker at [facility]   What is your office phone number: [phone] ext: [phone] [facility]    7.   What is your office fax number: [phone]  8.   Anesthesia type (None, local, MAC, general) ? Not specified   [facility] 08/07/2023, 9:34 AM  _________________________________________________________________   (provider comments below)

## 2023-08-14 NOTE — Telephone Encounter (Signed)
Spoke with the patient who states she has an appointment in February and can wait and address cardiac clearance at that time. Note added to appt notes.

## 2023-09-05 ENCOUNTER — Encounter: Payer: Self-pay | Admitting: Cardiology

## 2023-09-09 ENCOUNTER — Encounter: Payer: Self-pay | Admitting: Cardiology

## 2023-09-09 ENCOUNTER — Ambulatory Visit: Payer: PPO | Attending: Cardiology | Admitting: Cardiology

## 2023-09-09 VITALS — BP 132/80 | HR 74 | Ht 65.0 in | Wt 169.6 lb

## 2023-09-09 DIAGNOSIS — E785 Hyperlipidemia, unspecified: Secondary | ICD-10-CM

## 2023-09-09 DIAGNOSIS — I10 Essential (primary) hypertension: Secondary | ICD-10-CM

## 2023-09-09 DIAGNOSIS — Z01810 Encounter for preprocedural cardiovascular examination: Secondary | ICD-10-CM | POA: Diagnosis not present

## 2023-09-09 DIAGNOSIS — R0609 Other forms of dyspnea: Secondary | ICD-10-CM

## 2023-09-09 NOTE — Progress Notes (Unsigned)
Cardiology Office Note:    Date:  09/09/2023   ID:  [facility], DOB 11-17-1945, MRN [id number]  PCP:  Gordan Payment., MD  Cardiologist:  Gypsy Balsam, MD    Referring MD: Gordan Payment., MD   No chief complaint on file.   History of Present Illness:    [facility] is a 77 y.o. female past medical history significant for essential hypertension, dyslipidemia, elevated calcium score but only 8.83 with a overall risk of 6.4% per 10 years comes today to months for follow-up she does have a back problem within a year gradual deterioration with her condition getting tired very easily getting short of breath very easily.  She is contemplating back surgery actually she is going to see surgeon Thursday and surgery will be scheduled so she is here to be evaluated from cardiovascular point of view she have difficulty getting 4 METS.  Denies have any cardiac complaints but shortness of breath could be anginal equivalent.  Therefore I think we need to get echocardiogram to assess ejection fraction as well as stress test to rule out ischemia.  Past Medical History:  Diagnosis Date   Abnormal CXR 10/06/2018   Formatting of this note might be different from the original. She had abnormal CXR that showed COPD but patient does not think she does though she has had second hand smoke exposure, discussed with her recommendation for PFTs she wants to wait on this   Calcification of coronary artery 05/09/2022   Cancer (HCC)    Breast Ca   Chronic constipation 11/30/2019   Collagen vascular disease (HCC)    COPD suggested by initial evaluation (HCC) 06/22/2019   Dyslipidemia 05/08/2016   Dyspnea on exertion 03/10/2020   Elevated blood-pressure reading without diagnosis of hypertension 10/06/2018   Epistaxis, recurrent 02/19/2022   Essential hypertension 01/12/2022   History of breast cancer 12/31/2019   Hypertension    Malaise and fatigue 01/20/2016   Last Assessment & Plan:  Formatting  of this note might be different from the original. Relevant Hx: Course: Daily Update: Today's Plan:update her labs for her and discussed with her energy levels and her sleep which is stable overall  Electronically signed by: Krystal Clark, NP 01/23/16 [phone]   Mitral valve prolapse 01/20/2016   Last Assessment & Plan:  Relevant Hx: Course: Daily Update: Today's Plan:she follows with Dr. Bing Matter for her MVP and her [place] was done about year ago and it was stable for her  Electronically signed by: Krystal Clark, NP 01/23/16 0835   Mixed hyperlipidemia 01/20/2016   Last Assessment & Plan:  Relevant Hx: Course: Daily Update: Today's Plan:she is fasting for this and has been trying to watch her diet  Electronically signed by: Krystal Clark, NP 01/23/16 0832   Neuropathy    Osteopenia 01/20/2016   Last Assessment & Plan:  Formatting of this note might be different from the original. Relevant Hx: Course: Daily Update: Today's Plan:she is taking Bone Strength for her bones and she still follows with her GYN for her womens health and it was this year and she was advised fosamax but refuses, Dr. Rana Snare is her provider.  Electronically signed by: Krystal Clark, NP 01/23/16 0831   Personal history of radiation therapy    Primary osteoarthritis involving multiple joints 01/23/2016   Last Assessment & Plan:  Formatting of this note might be different from the original. Relevant Hx: Course: Daily Update: Today's Plan:she uses the ibuprofen and is  careful with this and would like to have the prescription strength version of this as is easier.  Electronically signed by: Krystal Clark, NP 01/23/16 [phone]   Thyroid nodule 07/01/2019    Past Surgical History:  Procedure Laterality Date   BREAST LUMPECTOMY Left 1999   CARPAL TUNNEL RELEASE     CATARACT EXTRACTION Bilateral    CHOLECYSTECTOMY     COLONOSCOPY     TONSILLECTOMY  1959    Current  Medications: Current Meds  Medication Sig   amLODipine (NORVASC) 5 MG tablet Take 1 tablet (5 mg total) by mouth daily.   BLACK COHOSH EXTRACT PO Take 100 mg by mouth daily.   cyanocobalamin (VITAMIN B12) 1000 MCG tablet Take 1,000 mcg by mouth daily.   ibuprofen (ADVIL) 200 MG tablet Take 800 mg by mouth every 6 (six) hours as needed for mild pain or moderate pain.   latanoprost (XALATAN) 0.005 % ophthalmic solution Place 1 drop into both eyes at bedtime.   Methylsulfonylmethane (MSM PO) Take 1,000 mg by mouth at bedtime.   OVER THE COUNTER MEDICATION Take 1,000 mg by mouth daily. 1000-400mg  Reed Breech Based calcium and citrate   polyethylene glycol (MIRALAX / GLYCOLAX) 17 g packet Take 17 g by mouth daily. 1 capful in the morning     Allergies:   Pravastatin   Social History   Socioeconomic History   Marital status: Married    Spouse name: Not on file   Number of children: Not on file   Years of education: Not on file   Highest education level: Not on file  Occupational History   Not on file  Tobacco Use   Smoking status: Never   Smokeless tobacco: Never  Vaping Use   Vaping status: Never Used  Substance and Sexual Activity   Alcohol use: Not Currently   Drug use: Never   Sexual activity: Not on file  Other Topics Concern   Not on file  Social History Narrative   Not on file   Social Drivers of Health   Financial Resource Strain: Not on file  Food Insecurity: Low Risk  (09/05/2023)   Received from [facility]   Hunger Vital Sign    Worried About Running Out of Food in the Last Year: Never true    Ran Out of Food in the Last Year: Never true  Transportation Needs: No Transportation Needs (09/05/2023)   Received from [facility]    In the past 12 months, has lack of reliable transportation kept you from medical appointments, meetings, work or from getting things needed for daily living? : No  Physical Activity: Not on file  Stress: Not on file   Social Connections: Not on file     Family History: The patient's family history includes Cervical cancer in her mother; Diabetes in her father; Hypertension in her father. There is no history of Colon cancer, Esophageal cancer, Rectal cancer, Stomach cancer, or Breast cancer. ROS:   Please see the history of present illness.    All 14 point review of systems negative except as described per history of present illness  EKGs/Labs/Other Studies Reviewed:    EKG Interpretation Date/Time:  Monday September 09 2023 16:15:44 EST Ventricular Rate:  74 PR Interval:  192 QRS Duration:  74 QT Interval:  374 QTC Calculation: 415 R Axis:   67  Text Interpretation: Normal sinus rhythm Low voltage QRS Septal infarct , age undetermined Abnormal ECG When compared with ECG of 09-May-2022 13:23,  No significant change was found Confirmed by Gypsy Balsam [id number]) on 09/09/2023 4:18:38 PM    Recent Labs: 09/27/2022: BUN 14; Creatinine, Ser 0.76; Potassium 4.1; Sodium 141  Recent Lipid Panel    Component Value Date/Time   CHOL 223 (H) 01/25/2023 0808   TRIG 50 01/25/2023 0808   HDL 69 01/25/2023 0808   CHOLHDL 3.2 01/25/2023 0808   LDLCALC 145 (H) 01/25/2023 0808    Physical Exam:    VS:  BP 132/80   Pulse 74   Ht 5\' 5"  (1.651 m)   Wt 169 lb 9.6 oz (76.9 kg)   SpO2 94%   BMI 28.22 kg/m     Wt Readings from Last 3 Encounters:  09/09/23 169 lb 9.6 oz (76.9 kg)  01/24/23 160 lb 9.6 oz (72.8 kg)  09/27/22 164 lb (74.4 kg)     GEN:  Well nourished, well developed in no acute distress HEENT: Normal NECK: No JVD; No carotid bruits LYMPHATICS: No lymphadenopathy CARDIAC: RRR, no murmurs, no rubs, no gallops RESPIRATORY:  Clear to auscultation without rales, wheezing or rhonchi  ABDOMEN: Soft, non-tender, non-distended MUSCULOSKELETAL:  No edema; No deformity  SKIN: Warm and dry LOWER EXTREMITIES: no swelling NEUROLOGIC:  Alert and oriented x 3 PSYCHIATRIC:  Normal affect    ASSESSMENT:    1. Essential hypertension   2. Preop cardiovascular exam   3. Dyspnea on exertion   4. Dyslipidemia    PLAN:    In order of problems listed above:  Cardiovascular preop evaluation will schedule him to have Lexiscan as well as echocardiogram to assess left ventricular ejection fraction.  If those 2 tests are fine obviously will be reasonable to proceed with surgery with no reservations. Essential hypertension blood pressure well-controlled continue present management. Dyslipidemia I did review her blood work done by primary care physician and calculated her 10 years predicted risk based on those blood test results is 6.4% which is intermediate group.  Continue with Zetia for now   Medication Adjustments/Labs and Tests Ordered: Current medicines are reviewed at length with the patient today.  Concerns regarding medicines are outlined above.  Orders Placed This Encounter  Procedures   EKG 12-Lead   Medication changes: No orders of the defined types were placed in this encounter.   Signed, Georgeanna Lea, MD, [facility] 09/09/2023 4:32 PM    [facility]

## 2023-09-09 NOTE — Patient Instructions (Addendum)
Medication Instructions:  Your physician recommends that you continue on your current medications as directed. Please refer to the Current Medication list given to you today.  *If you need a refill on your cardiac medications before your next appointment, please call your pharmacy*   Lab Work: None Ordered If you have labs (blood work) drawn today and your tests are completely normal, you will receive your results only by: MyChart Message (if you have MyChart) OR A paper copy in the mail If you have any lab test that is abnormal or we need to change your treatment, we will call you to review the results.   Testing/Procedures: Your physician has requested that you have an echocardiogram. Echocardiography is a painless test that uses sound waves to create images of your heart. It provides your doctor with information about the size and shape of your heart and how well your heart's chambers and valves are working. This procedure takes approximately one hour. There are no restrictions for this procedure. Please do NOT wear cologne, perfume, aftershave, or lotions (deodorant is allowed). Please arrive 15 minutes prior to your appointment time.  Please note: We ask at that you not bring children with you during ultrasound (echo/ vascular) testing. Due to room size and safety concerns, children are not allowed in the ultrasound rooms during exams. Our front office staff cannot provide observation of children in our lobby area while testing is being conducted. An adult accompanying a patient to their appointment will only be allowed in the ultrasound room at the discretion of the ultrasound technician under special circumstances. We apologize for any inconvenience.   Your physician has requested that you have a lexiscan myoview. For further information please visit [email or web address] Please follow instruction sheet, as given.  The test will take approximately 3 to 4 hours to complete; you may bring  reading material.  If someone comes with you to your appointment, they will need to remain in the main lobby due to limited space in the testing area.    How to prepare for your Myocardial Perfusion Test: Do not eat or drink 3 hours prior to your test, except you may have water. Do not consume products containing caffeine (regular or decaffeinated) 12 hours prior to your test. (ex: coffee, chocolate, sodas, tea). Do bring a list of your current medications with you.  If not listed below, you may take your medications as normal. Do wear comfortable clothes (no dresses or overalls) and walking shoes, tennis shoes preferred (No heels or open toe shoes are allowed). Do NOT wear cologne, perfume, aftershave, or lotions (deodorant is allowed). If these instructions are not followed, your test will have to be rescheduled. lex   Follow-Up: At [facility], you and your health needs are our priority.  As part of our continuing mission to provide you with exceptional heart care, we have created designated Provider Care Teams.  These Care Teams include your primary Cardiologist (physician) and Advanced Practice Providers (APPs -  Physician Assistants and Nurse Practitioners) who all work together to provide you with the care you need, when you need it.  We recommend signing up for the patient portal called "MyChart".  Sign up information is provided on this After Visit Summary.  MyChart is used to connect with patients for Virtual Visits (Telemedicine).  Patients are able to view lab/test results, encounter notes, upcoming appointments, etc.  Non-urgent messages can be sent to your provider as well.   To learn more about what you can  do with MyChart, go to [email or web address].    Your next appointment:   12 month(s)  The format for your next appointment:   In Person  Provider:   Gypsy Balsam, MD    Other Instructions NA

## 2023-09-10 ENCOUNTER — Telehealth (HOSPITAL_COMMUNITY): Payer: Self-pay | Admitting: *Deleted

## 2023-09-10 NOTE — Telephone Encounter (Signed)
 Patient given detailed instructions per Myocardial Perfusion Study Information Sheet for the test on 09/18/23 Patient notified to arrive 15 minutes early and that it is imperative to arrive on time for appointment to keep from having the test rescheduled.  If you need to cancel or reschedule your appointment, please call the office within 24 hours of your appointment. . Patient verbalized understanding.[facility]

## 2023-09-18 ENCOUNTER — Ambulatory Visit: Payer: PPO | Attending: Cardiology

## 2023-09-18 DIAGNOSIS — Z01810 Encounter for preprocedural cardiovascular examination: Secondary | ICD-10-CM

## 2023-09-18 MED ORDER — REGADENOSON 0.4 MG/5ML IV SOLN
0.4000 mg | Freq: Once | INTRAVENOUS | Status: AC
Start: 2023-09-18 — End: 2023-09-18
  Administered 2023-09-18: 0.4 mg via INTRAVENOUS

## 2023-09-18 MED ORDER — TECHNETIUM TC 99M TETROFOSMIN IV KIT
10.1000 | PACK | Freq: Once | INTRAVENOUS | Status: AC | PRN
  Administered 2023-09-18: 10.1 via INTRAVENOUS

## 2023-09-18 MED ORDER — TECHNETIUM TC 99M TETROFOSMIN IV KIT
28.1000 | PACK | Freq: Once | INTRAVENOUS | Status: AC | PRN
  Administered 2023-09-18: 28.1 via INTRAVENOUS

## 2023-09-19 LAB — MYOCARDIAL PERFUSION IMAGING
LV dias vol: 70 mL (ref 46–106)
LV sys vol: 20 mL
Nuc Stress EF: 71 %
Peak HR: 103 {beats}/min
Rest HR: 67 {beats}/min
Rest Nuclear Isotope Dose: 10.1 mCi
SDS: 1
SRS: 1
SSS: 2
Stress Nuclear Isotope Dose: 28.1 mCi
TID: 1.07

## 2023-09-27 ENCOUNTER — Ambulatory Visit: Payer: PPO | Attending: Cardiology

## 2023-09-27 DIAGNOSIS — Z01810 Encounter for preprocedural cardiovascular examination: Secondary | ICD-10-CM | POA: Diagnosis not present

## 2023-09-27 LAB — ECHOCARDIOGRAM COMPLETE
Area-P 1/2: 4.4 cm2
MV M vel: 5.6 m/s
MV Peak grad: 125.4 mm[Hg]
Radius: 0.3 cm
S' Lateral: 2.9 cm

## 2023-10-03 ENCOUNTER — Telehealth: Payer: Self-pay

## 2023-10-03 ENCOUNTER — Telehealth: Payer: Self-pay | Admitting: Cardiology

## 2023-10-03 NOTE — Telephone Encounter (Signed)
 Echo Results reviewed with pt as per Dr. Vanetta Shawl note.  Pt verbalized understanding and had no additional questions. Routed to PCP

## 2023-10-03 NOTE — Telephone Encounter (Signed)
Follow Up:    Patient said she would like for the doctor or nurse to please call and give and explain her Echo and Stress test results.

## 2023-10-04 ENCOUNTER — Telehealth: Payer: Self-pay | Admitting: Cardiology

## 2023-10-04 NOTE — Telephone Encounter (Signed)
Has appt Monday Oct 07, 2023

## 2023-10-04 NOTE — Telephone Encounter (Signed)
Pt is calling a nurse back for results. Pt wants an appt with only Dr Bing Matter. Said she spoke to nurse about this yesterday

## 2023-10-07 ENCOUNTER — Encounter: Payer: Self-pay | Admitting: Cardiology

## 2023-10-07 ENCOUNTER — Ambulatory Visit: Payer: PPO | Attending: Cardiology | Admitting: Cardiology

## 2023-10-07 ENCOUNTER — Telehealth: Payer: Self-pay | Admitting: Cardiology

## 2023-10-07 VITALS — BP 124/66 | HR 76 | Ht 65.0 in | Wt 167.8 lb

## 2023-10-07 DIAGNOSIS — R9439 Abnormal result of other cardiovascular function study: Secondary | ICD-10-CM | POA: Diagnosis not present

## 2023-10-07 DIAGNOSIS — M48062 Spinal stenosis, lumbar region with neurogenic claudication: Secondary | ICD-10-CM

## 2023-10-07 DIAGNOSIS — E785 Hyperlipidemia, unspecified: Secondary | ICD-10-CM

## 2023-10-07 DIAGNOSIS — R0609 Other forms of dyspnea: Secondary | ICD-10-CM | POA: Diagnosis not present

## 2023-10-07 MED ORDER — METOPROLOL TARTRATE 100 MG PO TABS
100.0000 mg | ORAL_TABLET | Freq: Once | ORAL | 0 refills | Status: DC
Start: 2023-10-07 — End: 2023-10-10

## 2023-10-07 NOTE — Progress Notes (Addendum)
Cardiology Office Note:    Date:  10/07/2023   ID:  [facility], DOB 04/19/1946, MRN [id number]  PCP:  Gordan Payment., MD  Cardiologist:  Gypsy Balsam, MD    Referring MD: Gordan Payment., MD   Chief Complaint  Patient presents with   Results    History of Present Illness:    [facility] is a 78 y.o. female past medical history significant for essential hypertension dyslipidemia she did have a calcium score done in 2023 which was only 8.83 which puts her at overall intermediate risk 6.4 per 10 years.  She does have stress test done recently and that was done for evaluation before back surgery.  Her ability to exercise limited because of chronic back problem she was not able to do 4 METS.  Stress test showed small area of ischemia involving LAD territory she is scared in the office to talk about this.  Denies have any chest pain tightness squeezing pressure burning chest but have difficulty walking because of back problem we had a long discussion more than 45 minutes talking about options for this situation option being medical therapy versus coronary CT angio versus cardiac catheterization after long deliberation she prefers less invasive test with coronary CT angio.  Test described including all risk benefits we will proceed.  Past Medical History:  Diagnosis Date   Abnormal CXR 10/06/2018   Formatting of this note might be different from the original. She had abnormal CXR that showed COPD but patient does not think she does though she has had second hand smoke exposure, discussed with her recommendation for PFTs she wants to wait on this   Calcification of coronary artery 05/09/2022   Cancer (HCC)    Breast Ca   Chronic constipation 11/30/2019   Collagen vascular disease (HCC)    COPD suggested by initial evaluation (HCC) 06/22/2019   Dyslipidemia 05/08/2016   Dyspnea on exertion 03/10/2020   Elevated blood-pressure reading without diagnosis of hypertension 10/06/2018    Epistaxis, recurrent 02/19/2022   Essential hypertension 01/12/2022   History of breast cancer 12/31/2019   Hypertension    Malaise and fatigue 01/20/2016   Last Assessment & Plan:  Formatting of this note might be different from the original. Relevant Hx: Course: Daily Update: Today's Plan:update her labs for her and discussed with her energy levels and her sleep which is stable overall  Electronically signed by: Krystal Clark, NP 01/23/16 [phone]   Mitral valve prolapse 01/20/2016   Last Assessment & Plan:  Relevant Hx: Course: Daily Update: Today's Plan:she follows with Dr. Bing Matter for her MVP and her [place] was done about year ago and it was stable for her  Electronically signed by: Krystal Clark, NP 01/23/16 0835   Mixed hyperlipidemia 01/20/2016   Last Assessment & Plan:  Relevant Hx: Course: Daily Update: Today's Plan:she is fasting for this and has been trying to watch her diet  Electronically signed by: Krystal Clark, NP 01/23/16 0832   Neuropathy    Osteopenia 01/20/2016   Last Assessment & Plan:  Formatting of this note might be different from the original. Relevant Hx: Course: Daily Update: Today's Plan:she is taking Bone Strength for her bones and she still follows with her GYN for her womens health and it was this year and she was advised fosamax but refuses, Dr. Rana Snare is her provider.  Electronically signed by: Krystal Clark, NP 01/23/16 0831   Personal history of radiation therapy  Primary osteoarthritis involving multiple joints 01/23/2016   Last Assessment & Plan:  Formatting of this note might be different from the original. Relevant Hx: Course: Daily Update: Today's Plan:she uses the ibuprofen and is careful with this and would like to have the prescription strength version of this as is easier.  Electronically signed by: Krystal Clark, NP 01/23/16 [phone]   Thyroid nodule 07/01/2019    Past Surgical History:   Procedure Laterality Date   BREAST LUMPECTOMY Left 1999   CARPAL TUNNEL RELEASE     CATARACT EXTRACTION Bilateral    CHOLECYSTECTOMY     COLONOSCOPY     TONSILLECTOMY  1959    Current Medications: Current Meds  Medication Sig   amLODipine (NORVASC) 5 MG tablet Take 1 tablet (5 mg total) by mouth daily.   BLACK COHOSH EXTRACT PO Take 100 mg by mouth daily.   cyanocobalamin (VITAMIN B12) 1000 MCG tablet Take 1,000 mcg by mouth daily.   ezetimibe (ZETIA) 10 MG tablet Take 1 tablet (10 mg total) by mouth daily.   ibuprofen (ADVIL) 200 MG tablet Take 800 mg by mouth every 6 (six) hours as needed for mild pain or moderate pain.   latanoprost (XALATAN) 0.005 % ophthalmic solution Place 1 drop into both eyes at bedtime.   Methylsulfonylmethane (MSM PO) Take 1,000 mg by mouth at bedtime.   OVER THE COUNTER MEDICATION Take 1,000 mg by mouth daily. 1000-400mg  Reed Breech Based calcium and citrate   polyethylene glycol (MIRALAX / GLYCOLAX) 17 g packet Take 17 g by mouth daily. 1 capful in the morning     Allergies:   Pravastatin   Social History   Socioeconomic History   Marital status: Married    Spouse name: Not on file   Number of children: Not on file   Years of education: Not on file   Highest education level: Not on file  Occupational History   Not on file  Tobacco Use   Smoking status: Never   Smokeless tobacco: Never  Vaping Use   Vaping status: Never Used  Substance and Sexual Activity   Alcohol use: Not Currently   Drug use: Never   Sexual activity: Not on file  Other Topics Concern   Not on file  Social History Narrative   Not on file   Social Drivers of Health   Financial Resource Strain: Not on file  Food Insecurity: Low Risk  (09/05/2023)   Received from [facility]   Hunger Vital Sign    Worried About Running Out of Food in the Last Year: Never true    Ran Out of Food in the Last Year: Never true  Transportation Needs: No Transportation Needs (09/05/2023)    Received from [facility]    In the past 12 months, has lack of reliable transportation kept you from medical appointments, meetings, work or from getting things needed for daily living? : No  Physical Activity: Not on file  Stress: Not on file  Social Connections: Not on file     Family History: The patient's family history includes Cervical cancer in her mother; Diabetes in her father; Hypertension in her father. There is no history of Colon cancer, Esophageal cancer, Rectal cancer, Stomach cancer, or Breast cancer. ROS:   Please see the history of present illness.    All 14 point review of systems negative except as described per history of present illness  EKGs/Labs/Other Studies Reviewed:         Recent  Labs: No results found for requested labs within last 365 days.  Recent Lipid Panel    Component Value Date/Time   CHOL 223 (H) 01/25/2023 0808   TRIG 50 01/25/2023 0808   HDL 69 01/25/2023 0808   CHOLHDL 3.2 01/25/2023 0808   LDLCALC 145 (H) 01/25/2023 0808    Physical Exam:    VS:  BP 124/66 (BP Location: Right Arm, Patient Position: Sitting)   Pulse 76   Ht 5\' 5"  (1.651 m)   Wt 167 lb 12.8 oz (76.1 kg)   SpO2 93%   BMI 27.92 kg/m     Wt Readings from Last 3 Encounters:  10/07/23 167 lb 12.8 oz (76.1 kg)  09/18/23 169 lb (76.7 kg)  09/09/23 169 lb 9.6 oz (76.9 kg)     GEN:  Well nourished, well developed in no acute distress HEENT: Normal NECK: No JVD; No carotid bruits LYMPHATICS: No lymphadenopathy CARDIAC: RRR, no murmurs, no rubs, no gallops RESPIRATORY:  Clear to auscultation without rales, wheezing or rhonchi  ABDOMEN: Soft, non-tender, non-distended MUSCULOSKELETAL:  No edema; No deformity  SKIN: Warm and dry LOWER EXTREMITIES: no swelling NEUROLOGIC:  Alert and oriented x 3 PSYCHIATRIC:  Normal affect   ASSESSMENT:    1. Abnormal stress test anterior wall ischemia   2. Dyslipidemia   3. Dyspnea on exertion   4. Spinal  stenosis of lumbar region with neurogenic claudication    PLAN:    In order of problems listed above:  Abnormal stress test showing a possibility of ischemia in LAD territory after long deliberation she persistently decided to proceed with coronary CT angio.  Will schedule the test. Dyslipidemia we will await results of coronary CT angio.  Overall her risk is intermediate however she does have abnormal stress test. Dyspnea on exertion echocardiogram showed preserved left ventricle ejection fraction. Cardiovascular valuation before back surgery.  Will await results of coronary CT angio. We discussed in details coronary CT angio risk and benefits as well as cardiac catheterization including risks and benefits.  She favor coronary CT angio.   Medication Adjustments/Labs and Tests Ordered: Current medicines are reviewed at length with the patient today.  Concerns regarding medicines are outlined above.  No orders of the defined types were placed in this encounter.  Medication changes: No orders of the defined types were placed in this encounter.   Signed, Georgeanna Lea, MD, [facility] 10/07/2023 8:38 AM     [facility]

## 2023-10-07 NOTE — Telephone Encounter (Signed)
Patient stated she is having second thoughts about doing procedure discussed at visit today and want time to think about this and talk to her family.  Patient wants a call back to discuss next steps.

## 2023-10-07 NOTE — Telephone Encounter (Signed)
Called patient and she states that she needs time to think about having a cardiac CT due to her having to have back surgery soon. Patient states that she will call our office back tomorrow to discuss if she wants to have the CT or not. Dr. Bing Matter made aware

## 2023-10-07 NOTE — Patient Instructions (Addendum)
Medication Instructions:   Two hours before your CT you will take Metoprolol 100 mg.   *If you need a refill on your cardiac medications before your next appointment, please call your pharmacy*   Lab Work:  Today you will have a BMP completed.   If you have labs (blood work) drawn today and your tests are completely normal, you will receive your results only by: MyChart Message (if you have MyChart) OR A paper copy in the mail If you have any lab test that is abnormal or we need to change your treatment, we will call you to review the results.   Testing/Procedures:   Your cardiac CT will be scheduled at:   [facility]  [address]  [place], [place] [address]      On the Night Before the Test: Be sure to Drink plenty of water. Do not consume any caffeinated/decaffeinated beverages or chocolate 12 hours prior to your test. Do not take any antihistamines 12 hours prior to your test.   On the Day of the Test: Drink plenty of water until 1 hour prior to the test. Do not eat any food 4 hours prior to the test. You may take your regular medications prior to the test.  Take metoprolol (Lopressor) two hours prior to test. FEMALES- please wear underwire-free bra if available, avoid dresses & tight clothing        After the Test: Drink plenty of water. After receiving IV contrast, you may experience a mild flushed feeling. This is normal. On occasion, you may experience a mild rash up to 24 hours after the test. This is not dangerous. If this occurs, you can take Benadryl 25 mg and increase your fluid intake. If you experience trouble breathing, this can be serious. If it is severe call 911 IMMEDIATELY. If it is mild, please call our office. If you take any of these medications: Glipizide/Metformin, Avandament, Glucavance, please do not take 48 hours after completing test unless otherwise instructed.  We will call to schedule your test 2-4 Dominey out  understanding that some insurance companies will need an authorization prior to the service being performed.   For non-scheduling related questions, please contact the cardiac imaging nurse navigator should you have any questions/concerns: Rockwell Alexandria, Cardiac Imaging Nurse Navigator Larey Brick, Cardiac Imaging Nurse Navigator [facility] Direct Office Dial: [phone]   For scheduling needs, including cancellations and rescheduling, please call [place], [phone].    Follow-Up: At [facility], you and your health needs are our priority.  As part of our continuing mission to provide you with exceptional heart care, we have created designated Provider Care Teams.  These Care Teams include your primary Cardiologist (physician) and Advanced Practice Providers (APPs -  Physician Assistants and Nurse Practitioners) who all work together to provide you with the care you need, when you need it.  We recommend signing up for the patient portal called "MyChart".  Sign up information is provided on this After Visit Summary.  MyChart is used to connect with patients for Virtual Visits (Telemedicine).  Patients are able to view lab/test results, encounter notes, upcoming appointments, etc.  Non-urgent messages can be sent to your provider as well.   To learn more about what you can do with MyChart, go to [email or web address].       Other Instructions Cardiac CT Angiogram A cardiac CT angiogram is a procedure to look at the heart and the area around the heart.  It may be done to help find the cause of chest pains or other symptoms of heart disease. During this procedure, a substance called contrast dye is injected into the blood vessels in the area to be checked. A large X-ray machine, called a CT scanner, then takes detailed pictures of the heart and the surrounding area. The procedure is also sometimes called a coronary CT angiogram, coronary artery scanning, or  CTA. A cardiac CT angiogram allows the health care provider to see how well blood is flowing to and from the heart. The health care provider will be able to see if there are any problems, such as: Blockage or narrowing of the coronary arteries in the heart. Fluid around the heart. Signs of weakness or disease in the muscles, valves, and tissues of the heart. Tell a health care provider about: Any allergies you have. This is especially important if you have had a previous allergic reaction to contrast dye. All medicines you are taking, including vitamins, herbs, eye drops, creams, and over-the-counter medicines. Any blood disorders you have. Any surgeries you have had. Any medical conditions you have. Whether you are pregnant or may be pregnant. Any anxiety disorders, chronic pain, or other conditions you have that may increase your stress or prevent you from lying still. What are the risks? Generally, this is a safe procedure. However, problems may occur, including: Bleeding. Infection. Allergic reactions to medicines or dyes. Damage to other structures or organs. Kidney damage from the contrast dye that is used. Increased risk of cancer from radiation exposure. This risk is low. Talk with your health care provider about: The risks and benefits of testing. How you can receive the lowest dose of radiation. What happens before the procedure? Wear comfortable clothing and remove any jewelry, glasses, dentures, and hearing aids. Follow instructions from your health care provider about eating and drinking. This may include: For 12 hours before the procedure -- avoid caffeine. This includes tea, coffee, soda, energy drinks, and diet pills. Drink plenty of water or other fluids that do not have caffeine in them. Being well hydrated can prevent complications. For 4-6 hours before the procedure -- stop eating and drinking. The contrast dye can cause nausea, but this is less likely if your stomach  is empty. Ask your health care provider about changing or stopping your regular medicines. This is especially important if you are taking diabetes medicines, blood thinners, or medicines to treat problems with erections (erectile dysfunction). What happens during the procedure?  Hair on your chest may need to be removed so that small sticky patches called electrodes can be placed on your chest. These will transmit information that helps to monitor your heart during the procedure. An IV will be inserted into one of your veins. You might be given a medicine to control your heart rate during the procedure. This will help to ensure that good images are obtained. You will be asked to lie on an exam table. This table will slide in and out of the CT machine during the procedure. Contrast dye will be injected into the IV. You might feel warm, or you may get a metallic taste in your mouth. You will be given a medicine called nitroglycerin. This will relax or dilate the arteries in your heart. The table that you are lying on will move into the CT machine tunnel for the scan. The person running the machine will give you instructions while the scans are being done. You may be asked to: Keep  your arms above your head. Hold your breath. Stay very still, even if the table is moving. When the scanning is complete, you will be moved out of the machine. The IV will be removed. The procedure may vary among health care providers and hospitals. What can I expect after the procedure? After your procedure, it is common to have: A metallic taste in your mouth from the contrast dye. A feeling of warmth. A headache from the nitroglycerin. Follow these instructions at home: Take over-the-counter and prescription medicines only as told by your health care provider. If you are told, drink enough fluid to keep your urine pale yellow. This will help to flush the contrast dye out of your body. Most people can return to  their normal activities right after the procedure. Ask your health care provider what activities are safe for you. It is up to you to get the results of your procedure. Ask your health care provider, or the department that is doing the procedure, when your results will be ready. Keep all follow-up visits as told by your health care provider. This is important. Contact a health care provider if: You have any symptoms of allergy to the contrast dye. These include: Shortness of breath. Rash or hives. A racing heartbeat. Summary A cardiac CT angiogram is a procedure to look at the heart and the area around the heart. It may be done to help find the cause of chest pains or other symptoms of heart disease. During this procedure, a large X-ray machine, called a CT scanner, takes detailed pictures of the heart and the surrounding area after a contrast dye has been injected into blood vessels in the area. Ask your health care provider about changing or stopping your regular medicines before the procedure. This is especially important if you are taking diabetes medicines, blood thinners, or medicines to treat erectile dysfunction. If you are told, drink enough fluid to keep your urine pale yellow. This will help to flush the contrast dye out of your body. This information is not intended to replace advice given to you by your health care provider. Make sure you discuss any questions you have with your health care provider. Document Revised: 04/22/2019 Document Reviewed: 04/22/2019 Elsevier Patient Education  2020 [employer].

## 2023-10-08 NOTE — Telephone Encounter (Signed)
Called and spoke to patient. Patient reports that she will have the Cardiac CT completed. 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 Patient had no further questions.

## 2023-10-08 NOTE — Telephone Encounter (Signed)
Pt called in stating she would like to have CT and asked for nurse to call her back

## 2023-10-09 ENCOUNTER — Telehealth: Payer: Self-pay | Admitting: Cardiology

## 2023-10-09 NOTE — Telephone Encounter (Signed)
Patient came in office stating that she already has a CT scheduled but she would like to just go on ahead and get the cath procedure done instead so it won't further delay her back surgery. CB # [phone]

## 2023-10-09 NOTE — Telephone Encounter (Signed)
Pt calling requesting cb. Has questions additional questions regarding procedure/CT

## 2023-10-10 ENCOUNTER — Telehealth: Payer: Self-pay | Admitting: Cardiology

## 2023-10-10 ENCOUNTER — Telehealth: Payer: Self-pay

## 2023-10-10 DIAGNOSIS — I10 Essential (primary) hypertension: Secondary | ICD-10-CM

## 2023-10-10 DIAGNOSIS — R9439 Abnormal result of other cardiovascular function study: Secondary | ICD-10-CM

## 2023-10-10 NOTE — Telephone Encounter (Signed)
Medication Instructions:  None     *If you need a refill on your cardiac medications before your next appointment, please call your pharmacy*   Lab Work: CBC, BMP- Come by lab anytime. Do not need to be fasting. M-F 8-4:30. Closed 12-1 for lunch.  If you have labs (blood work) drawn today and your tests are completely normal, you will receive your results only by: MyChart Message (if you have MyChart) OR A paper copy in the mail If you have any lab test that is abnormal or we need to change your treatment, we will call you to review the results.  Will need EKG when you come for Labs- let me know when so I can put on schedule   Testing/Procedures:  [facility]  [address] [place] [address] Dept: [phone] Loc: [phone]  Deanna Walsh  10/10/2023  You are scheduled for a Cardiac Catheterization on Wednesday, February 5 with Dr. Lorine Bears.  1. Please arrive at the [facility] (Main Entrance A) at [facility]: [address] [place], [place] [address] at 8:00 AM (This time is 2 hour(s) before your procedure to ensure your preparation).   Free valet parking service is available. You will check in at ADMITTING. The support person will be asked to wait in the waiting room.  It is OK to have someone drop you off and come back when you are ready to be discharged.    Special note: Every effort is made to have your procedure done on time. Please understand that emergencies sometimes delay scheduled procedures.  2. Diet: Do not eat solid foods after midnight.  The patient may have clear liquids until 5am upon the day of the procedure.  3. Labs: You will need to have blood drawn on Friday, January 31 or Monday Oct 14, 2023  at [employer] [employer]: [address] Copywriter, advertising . You do not need to be fasting.  4. Medication instructions in preparation for your procedure:    Contrast Allergy: No    On the morning of your procedure, take your Aspirin 81 mg and any morning medicines NOT listed above.  You may use sips of water.  5. Plan to go home the same day, you will only stay overnight if medically necessary. 6. Bring a current list of your medications and current insurance cards. 7. You MUST have a responsible person to drive you home. 8. Someone MUST be with you the first 24 hours after you arrive home or your discharge will be delayed. 9. Please wear clothes that are easy to get on and off and wear slip-on shoes.  Thank you for allowing [place] to care for you!   -- [facility]    Follow-Up: At [facility] you and your health needs are our priority.  As part of our continuing mission to provide you with exceptional heart care, we have created designated Provider Care Teams.  These Care Teams include your primary Cardiologist (physician) and Advanced Practice Providers (APPs -  Physician Assistants and Nurse Practitioners) who all work together to provide you with the care you need, when you need it.  We recommend signing up for the patient portal called "MyChart".  Sign up information is provided on this After Visit Summary.  MyChart is used to connect with patients for Virtual Visits (Telemedicine).  Patients are able to view lab/test results, encounter notes,  upcoming appointments, etc.  Non-urgent messages can be sent to your provider as well.   To learn more about what you can do with MyChart, go to [email or web address].    Your next appointment:   2 month(s)  The format for your next appointment:   In Person  Provider:   Gypsy Balsam, MD   Other Instructions  Coronary Angiogram With Stent Coronary angiogram with stent placement is a procedure to widen or open a narrow blood vessel of the heart (coronary artery). Arteries may become blocked by cholesterol buildup (plaques) in the lining of the artery  wall. When a coronary artery becomes partially blocked, blood flow to that area decreases. This may lead to chest pain or a heart attack (myocardial infarction). A stent is a small piece of metal that looks like mesh or spring. Stent placement may be done as treatment after a heart attack, or to prevent a heart attack if a blocked artery is found by a coronary angiogram. Let your health care provider know about: Any allergies you have, including allergies to medicines or contrast dye. All medicines you are taking, including vitamins, herbs, eye drops, creams, and over-the-counter medicines. Any problems you or family members have had with anesthetic medicines. Any blood disorders you have. Any surgeries you have had. Any medical conditions you have, including kidney problems or kidney failure. Whether you are pregnant or may be pregnant. Whether you are breastfeeding. What are the risks? Generally, this is a safe procedure. However, serious problems may occur, including: Damage to nearby structures or organs, such as the heart, blood vessels, or kidneys. A return of blockage. Bleeding, infection, or bruising at the insertion site. A collection of blood under the skin (hematoma) at the insertion site. A blood clot in another part of the body. Allergic reaction to medicines or dyes. Bleeding into the abdomen (retroperitoneal bleeding). Stroke (rare). Heart attack (rare). What happens before the procedure? Staying hydrated Follow instructions from your health care provider about hydration, which may include: Up to 2 hours before the procedure - you may continue to drink clear liquids, such as water, clear fruit juice, black coffee, and plain tea.    Eating and drinking restrictions Follow instructions from your health care provider about eating and drinking, which may include: 8 hours before the procedure - stop eating heavy meals or foods, such as meat, fried foods, or fatty foods. 6  hours before the procedure - stop eating light meals or foods, such as toast or cereal. 2 hours before the procedure - stop drinking clear liquids. Medicines Ask your health care provider about: Changing or stopping your regular medicines. This is especially important if you are taking diabetes medicines or blood thinners. Taking medicines such as aspirin and ibuprofen. These medicines can thin your blood. Do not take these medicines unless your health care provider tells you to take them. Generally, aspirin is recommended before a thin tube, called a catheter, is passed through a blood vessel and inserted into the heart (cardiac catheterization). Taking over-the-counter medicines, vitamins, herbs, and supplements. General instructions Do not use any products that contain nicotine or tobacco for at least 4 Berisha before the procedure. These products include cigarettes, e-cigarettes, and chewing tobacco. If you need help quitting, ask your health care provider. Plan to have someone take you home from the hospital or clinic. If you will be going home right after the procedure, plan to have someone with you for 24 hours. You may have tests and imaging procedures.  Ask your health care provider: How your insertion site will be marked. Ask which artery will be used for the procedure. What steps will be taken to help prevent infection. These may include: Removing hair at the insertion site. Washing skin with a germ-killing soap. Taking antibiotic medicine. What happens during the procedure? An IV will be inserted into one of your veins. Electrodes may be placed on your chest to monitor your heart rate during the procedure. You will be given one or more of the following: A medicine to help you relax (sedative). A medicine to numb the area (local anesthetic) for catheter insertion. A small incision will be made for catheter insertion. The catheter will be inserted into an artery using a guide wire.  The location may be in your groin, your wrist, or the fold of your arm (near your elbow). An X-ray procedure (fluoroscopy) will be used to help guide the catheter to the opening of the heart arteries. A dye will be injected into the catheter. X-rays will be taken. The dye helps to show where any narrowing or blockages are located in the arteries. Tell your health care provider if you have chest pain or trouble breathing. A tiny wire will be guided to the blocked spot, and a balloon will be inflated to make the artery wider. The stent will be expanded to crush the plaques into the wall of the vessel. The stent will hold the area open and improve the blood flow. Most stents have a drug coating to reduce the risk of the stent narrowing over time. The artery may be made wider using a drill, laser, or other tools that remove plaques. The catheter will be removed when the blood flow improves. The stent will stay where it was placed, and the lining of the artery will grow over it. A bandage (dressing) will be placed on the insertion site. Pressure will be applied to stop bleeding. The IV will be removed. This procedure may vary among health care providers and hospitals.    What happens after the procedure? Your blood pressure, heart rate, breathing rate, and blood oxygen level will be monitored until you leave the hospital or clinic. If the procedure is done through the leg, you will lie flat in bed for a few hours or for as long as told by your health care provider. You will be instructed not to bend or cross your legs. The insertion site and the pulse in your foot or wrist will be checked often. You may have more blood tests, X-rays, and a test that records the electrical activity of your heart (electrocardiogram, or ECG). Do not drive for 24 hours if you were given a sedative during your procedure. Summary Coronary angiogram with stent placement is a procedure to widen or open a narrowed coronary  artery. This is done to treat heart problems. Before the procedure, let your health care provider know about all the medical conditions and surgeries you have or have had. This is a safe procedure. However, some problems may occur, including damage to nearby structures or organs, bleeding, blood clots, or allergies. Follow your health care provider's instructions about eating, drinking, medicines, and other lifestyle changes, such as quitting tobacco use before the procedure. This information is not intended to replace advice given to you by your health care provider. Make sure you discuss any questions you have with your health care provider. Document Revised: 03/18/2019 Document Reviewed: 03/18/2019 Elsevier Patient Education  2021 [employer].

## 2023-10-10 NOTE — Telephone Encounter (Signed)
Patient wants a call back to confirm her procedure scheduled on 2/5 has been pre-authorized and that Dr. Kirke Corin is in her network.

## 2023-10-10 NOTE — Telephone Encounter (Signed)
Patient wants to speak with Dr. Bing Matter prior to scheduling cardiac CT

## 2023-10-10 NOTE — Telephone Encounter (Signed)
Patient notified that pre-authorization is not required and Dr. Kirke Corin is in her network.

## 2023-10-10 NOTE — Addendum Note (Signed)
Addended by: Baldo Ash D on: 10/10/2023 10:48 AM   Modules accepted: Orders

## 2023-10-11 ENCOUNTER — Ambulatory Visit: Payer: PPO | Attending: Cardiology

## 2023-10-11 VITALS — BP 130/80 | HR 70 | Ht 65.0 in

## 2023-10-11 DIAGNOSIS — R9439 Abnormal result of other cardiovascular function study: Secondary | ICD-10-CM

## 2023-10-11 DIAGNOSIS — I10 Essential (primary) hypertension: Secondary | ICD-10-CM

## 2023-10-12 LAB — BASIC METABOLIC PANEL
BUN/Creatinine Ratio: 18 (ref 12–28)
BUN: 13 mg/dL (ref 8–27)
CO2: 27 mmol/L (ref 20–29)
Calcium: 10.2 mg/dL (ref 8.7–10.3)
Chloride: 102 mmol/L (ref 96–106)
Creatinine, Ser: 0.74 mg/dL (ref 0.57–1.00)
Glucose: 79 mg/dL (ref 70–99)
Potassium: 4.4 mmol/L (ref 3.5–5.2)
Sodium: 142 mmol/L (ref 134–144)
eGFR: 84 mL/min/{1.73_m2} (ref >59)

## 2023-10-15 ENCOUNTER — Telehealth: Payer: Self-pay | Admitting: *Deleted

## 2023-10-15 NOTE — Telephone Encounter (Signed)
 Cardiac Catheterization scheduled at [facility] for: Wednesday October 16, 2023 10 AM Arrival time [facility] Main Entrance A at: 8 AM  Nothing to eat after midnight prior to procedure, clear liquids until 5 AM day of procedure.  Medication instructions: -Usual morning medications can be taken with sips of water including aspirin  81 mg.  Plan to go home the same day, you will only stay overnight if medically necessary.  You must have responsible adult to drive you home.  Someone must be with you the first 24 hours after you arrive home.  Reviewed procedure instructions with patient.

## 2023-10-16 ENCOUNTER — Other Ambulatory Visit: Payer: Self-pay

## 2023-10-16 ENCOUNTER — Encounter (HOSPITAL_COMMUNITY)
Admission: RE | Disposition: A | Discharge status: Home / Self Care | Payer: Self-pay | Source: Home / Self Care | Attending: Cardiovascular Disease

## 2023-10-16 ENCOUNTER — Ambulatory Visit (HOSPITAL_COMMUNITY)
Admission: RE | Admit: 2023-10-16 | Discharge: 2023-10-16 | Disposition: A | Discharge status: Home / Self Care | Payer: PPO | Attending: Cardiovascular Disease | Admitting: Cardiovascular Disease

## 2023-10-16 DIAGNOSIS — I2510 Atherosclerotic heart disease of native coronary artery without angina pectoris: Secondary | ICD-10-CM | POA: Insufficient documentation

## 2023-10-16 DIAGNOSIS — I10 Essential (primary) hypertension: Secondary | ICD-10-CM | POA: Insufficient documentation

## 2023-10-16 DIAGNOSIS — R9439 Abnormal result of other cardiovascular function study: Secondary | ICD-10-CM | POA: Diagnosis present

## 2023-10-16 DIAGNOSIS — E785 Hyperlipidemia, unspecified: Secondary | ICD-10-CM | POA: Insufficient documentation

## 2023-10-16 DIAGNOSIS — R0609 Other forms of dyspnea: Secondary | ICD-10-CM | POA: Diagnosis not present

## 2023-10-16 HISTORY — PX: LEFT HEART CATH AND CORONARY ANGIOGRAPHY: CATH118249

## 2023-10-16 LAB — CBC
Hematocrit: 44.1 % (ref 34.0–46.6)
Hemoglobin: 15.2 g/dL (ref 11.1–15.9)
MCH: 32.3 pg (ref 26.6–33.0)
MCHC: 34.5 g/dL (ref 31.5–35.7)
MCV: 94 fL (ref 79–97)
Platelets: 198 10*3/uL (ref 150–450)
RBC: 4.7 x10E6/uL (ref 3.77–5.28)
RDW: 11.8 % (ref 11.7–15.4)
WBC: 6.2 10*3/uL (ref 3.4–10.8)

## 2023-10-16 SURGERY — LEFT HEART CATH AND CORONARY ANGIOGRAPHY
Anesthesia: LOCAL

## 2023-10-16 MED ORDER — FENTANYL CITRATE (PF) 100 MCG/2ML IJ SOLN
INTRAMUSCULAR | Status: AC
  Filled 2023-10-16: qty 2

## 2023-10-16 MED ORDER — HEPARIN (PORCINE) IN NACL 1000-0.9 UT/500ML-% IV SOLN
INTRAVENOUS | Status: DC | PRN
  Administered 2023-10-16 (×2): 500 mL

## 2023-10-16 MED ORDER — HEPARIN SODIUM (PORCINE) 1000 UNIT/ML IJ SOLN
INTRAMUSCULAR | Status: AC
  Filled 2023-10-16: qty 10

## 2023-10-16 MED ORDER — IOHEXOL 350 MG/ML SOLN
INTRAVENOUS | Status: DC | PRN
  Administered 2023-10-16: 28 mL

## 2023-10-16 MED ORDER — SODIUM CHLORIDE 0.9 % WEIGHT BASED INFUSION: 3.0000 mL/kg/h | INTRAVENOUS | Status: AC

## 2023-10-16 MED ORDER — MIDAZOLAM HCL 2 MG/2ML IJ SOLN
INTRAMUSCULAR | Status: DC | PRN
  Administered 2023-10-16: 1 mg via INTRAVENOUS

## 2023-10-16 MED ORDER — VERAPAMIL HCL 2.5 MG/ML IV SOLN
INTRAVENOUS | Status: AC
  Filled 2023-10-16: qty 2

## 2023-10-16 MED ORDER — LIDOCAINE HCL (PF) 1 % IJ SOLN
INTRAMUSCULAR | Status: DC | PRN
  Administered 2023-10-16: 2 mL

## 2023-10-16 MED ORDER — VERAPAMIL HCL 2.5 MG/ML IV SOLN
INTRAVENOUS | Status: DC | PRN
  Administered 2023-10-16: 10 mL via INTRA_ARTERIAL

## 2023-10-16 MED ORDER — ASPIRIN 81 MG PO CHEW: 81.0000 mg | CHEWABLE_TABLET | ORAL | Status: DC

## 2023-10-16 MED ORDER — SODIUM CHLORIDE 0.9 % WEIGHT BASED INFUSION: 1.0000 mL/kg/h | INTRAVENOUS | Status: DC

## 2023-10-16 MED ORDER — MIDAZOLAM HCL 2 MG/2ML IJ SOLN
INTRAMUSCULAR | Status: AC
  Filled 2023-10-16: qty 2

## 2023-10-16 MED ORDER — FENTANYL CITRATE (PF) 100 MCG/2ML IJ SOLN
INTRAMUSCULAR | Status: DC | PRN
  Administered 2023-10-16: 25 ug via INTRAVENOUS

## 2023-10-16 MED ORDER — HEPARIN SODIUM (PORCINE) 1000 UNIT/ML IJ SOLN
INTRAMUSCULAR | Status: DC | PRN
  Administered 2023-10-16: 4000 [IU] via INTRAVENOUS

## 2023-10-16 MED ORDER — LIDOCAINE HCL (PF) 1 % IJ SOLN
INTRAMUSCULAR | Status: AC
  Filled 2023-10-16: qty 30

## 2023-10-16 SURGICAL SUPPLY — 7 items
CATH INFINITI AMBI 5FR JK (CATHETERS) IMPLANT
DEVICE RAD COMP TR BAND LRG (VASCULAR PRODUCTS) IMPLANT
GLIDESHEATH SLEND SS 6F .021 (SHEATH) IMPLANT
GUIDEWIRE INQWIRE 1.5J.035X260 (WIRE) IMPLANT
INQWIRE 1.5J .035X260CM (WIRE) ×1 IMPLANT
PACK CARDIAC CATHETERIZATION (CUSTOM PROCEDURE TRAY) ×1 IMPLANT
SET ATX-X65L (MISCELLANEOUS) IMPLANT

## 2023-10-16 NOTE — Discharge Instructions (Signed)

## 2023-10-16 NOTE — Interval H&P Note (Signed)
 History and Physical Interval Note:  10/16/2023 10:15 AM  Deanna Walsh  has presented today for surgery, with the diagnosis of abnormal stress test.  The various methods of treatment have been discussed with the patient and family. After consideration of risks, benefits and other options for treatment, the patient has consented to  Procedure(s): LEFT HEART CATH AND CORONARY ANGIOGRAPHY (N/A) as a surgical intervention.  The patient's history has been reviewed, patient examined, no change in status, stable for surgery.  I have reviewed the patient's chart and labs.  Questions were answered to the patient's satisfaction.     Brison Fiumara

## 2023-10-17 ENCOUNTER — Ambulatory Visit: Payer: PPO | Admitting: Cardiology

## 2023-10-17 ENCOUNTER — Encounter (HOSPITAL_COMMUNITY): Payer: Self-pay | Admitting: Cardiovascular Disease

## 2023-10-17 ENCOUNTER — Telehealth: Payer: Self-pay

## 2023-10-17 NOTE — Telephone Encounter (Signed)
   [facility] Pre-operative Risk Assessment    Request for surgical clearance:  What type of surgery is being performed? Lumbar Fusion   When is this surgery scheduled? TBD   What type of clearance is required (medical clearance vs. Pharmacy clearance to hold med vs. Both)? Both  Are there any medications that need to be held prior to surgery and how long?Not specified   Practice name and name of physician performing surgery? Dr. Reyes Budge at [facility]    What is your office phone number: [phone]    7.   What is your office fax [phone]  8.   Anesthesia type (None, local, MAC, general) ? General   Deanna Walsh Deanna Walsh 10/17/2023, 4:34 PM  _________________________________________________________________   (provider comments below)

## 2023-10-18 NOTE — Telephone Encounter (Addendum)
 Deanna Walsh. [place] 78 year old female is requesting preoperative cardiac evaluation for lumbar fusion.  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  Her echocardiogram showed normal EF and G1 DD.  Please provide recommendations for cardiac risk for upcoming surgery.  Thank you for your help.  Please direct your response to CV DIV preop hold.  Deanna Walsh. Vershawn Westrup NP-C     10/18/2023, 1:53 PM [facility] [address] Office [phone] Fax [phone]

## 2023-10-21 ENCOUNTER — Other Ambulatory Visit: Payer: Self-pay | Admitting: Neurosurgery

## 2023-10-22 NOTE — Telephone Encounter (Signed)
   Patient Name: Deanna Walsh  DOB: 20-Jan-1946 MRN: [id number]  Primary Cardiologist: Gypsy Balsam, MD  Chart reviewed as part of pre-operative protocol coverage. Given past medical history and time since last visit, based on ACC/AHA guidelines, ANNICK DIMAIO is at acceptable risk for the planned procedure without further cardiovascular testing.   Per Dr. Bing Matter, "Should be no problem to pursue surgical intervention, at low risk from cardiac standpoint."   From a cardiac standpoint, if patient is taking aspirin 81 mg daily this can be held for 5 to 7 days prior to procedure. Please resume when safe to do so from a bleeding standpoint.   I will route this recommendation to the requesting party via Epic fax function and remove from pre-op pool.  Please call with questions.  Rip Harbour, NP 10/22/2023, 10:15 AM

## 2023-11-14 ENCOUNTER — Other Ambulatory Visit: Payer: Self-pay | Admitting: Neurosurgery

## 2023-11-22 NOTE — Pre-Procedure Instructions (Signed)
 Surgical Instructions   Your procedure is scheduled on December 04, 2023. Report to [facility] Main Entrance "A" at 11:20 A.M., then check in with the Admitting office. Any questions or running late day of surgery: call [phone]  Questions prior to your surgery date: call [phone], Monday-Friday, 8am-4pm. If you experience any cold or flu symptoms such as cough, fever, chills, shortness of breath, etc. between now and your scheduled surgery, please notify us at the above number.     Remember:  Do not eat or drink after midnight the night before your surgery    Take these medicines the morning of surgery with A SIP OF WATER: amLODipine (NORVASC)    May take these medicines IF NEEDED: acetaminophen (TYLENOL)  Propylene Glycol, PF, (SYSTANE COMPLETE PF) eye drops   One week prior to surgery, STOP taking any Aspirin (unless otherwise instructed by your surgeon) Aleve, Naproxen, Ibuprofen, Motrin, Advil, Goody's, BC's, all herbal medications, fish oil, and non-prescription vitamins.                     Do NOT Smoke (Tobacco/Vaping) for 24 hours prior to your procedure.  If you use a CPAP at night, you may bring your mask/headgear for your overnight stay.   You will be asked to remove any contacts, glasses, piercing's, hearing aid's, dentures/partials prior to surgery. Please bring cases for these items if needed.    Patients discharged the day of surgery will not be allowed to drive home, and someone needs to stay with them for 24 hours.  SURGICAL WAITING ROOM VISITATION Patients may have no more than 2 support people in the waiting area - these visitors may rotate.   Pre-op nurse will coordinate an appropriate time for 1 ADULT support person, who may not rotate, to accompany patient in pre-op.  Children under the age of 75 must have an adult with them who is not the patient and must remain in the main waiting area with an adult.  If the patient needs to stay at the hospital  during part of their recovery, the visitor guidelines for inpatient rooms apply.  Please refer to the [facility] website for the visitor guidelines for any additional information.   If you received a COVID test during your pre-op visit  it is requested that you wear a mask when out in public, stay away from anyone that may not be feeling well and notify your surgeon if you develop symptoms. If you have been in contact with anyone that has tested positive in the last 10 days please notify you surgeon.      Pre-operative 5 CHG Bathing Instructions   You can play a key role in reducing the risk of infection after surgery. Your skin needs to be as free of germs as possible. You can reduce the number of germs on your skin by washing with CHG (chlorhexidine gluconate) soap before surgery. CHG is an antiseptic soap that kills germs and continues to kill germs even after washing.   DO NOT use if you have an allergy to chlorhexidine/CHG or antibacterial soaps. If your skin becomes reddened or irritated, stop using the CHG and notify one of our RNs at [phone].   Please shower with the CHG soap starting 4 days before surgery using the following schedule:     Please keep in mind the following:  DO NOT shave, including legs and underarms, starting the day of your first shower.   You may shave your face  at any point before/day of surgery.  Place clean sheets on your bed the day you start using CHG soap. Use a clean washcloth (not used since being washed) for each shower. DO NOT sleep with pets once you start using the CHG.   CHG Shower Instructions:  Wash your face and private area with normal soap. If you choose to wash your hair, wash first with your normal shampoo.  After you use shampoo/soap, rinse your hair and body thoroughly to remove shampoo/soap residue.  Turn the water OFF and apply about 3 tablespoons (45 ml) of CHG soap to a CLEAN washcloth.  Apply CHG soap ONLY FROM YOUR NECK DOWN  TO YOUR TOES (washing for 3-5 minutes)  DO NOT use CHG soap on face, private areas, open wounds, or sores.  Pay special attention to the area where your surgery is being performed.  If you are having back surgery, having someone wash your back for you may be helpful. Wait 2 minutes after CHG soap is applied, then you may rinse off the CHG soap.  Pat dry with a clean towel  Put on clean clothes/pajamas   If you choose to wear lotion, please use ONLY the CHG-compatible lotions that are listed below.  Additional instructions for the day of surgery: DO NOT APPLY any lotions, deodorants, cologne, or perfumes.   Do not bring valuables to the hospital. [facility] is not responsible for any belongings/valuables. Do not wear nail polish, gel polish, artificial nails, or any other type of covering on natural nails (fingers and toes) Do not wear jewelry or makeup Put on clean/comfortable clothes.  Please brush your teeth.  Ask your nurse before applying any prescription medications to the skin.     CHG Compatible Lotions   Aveeno Moisturizing lotion  Cetaphil Moisturizing Cream  Cetaphil Moisturizing Lotion  Clairol Herbal Essence Moisturizing Lotion, Dry Skin  Clairol Herbal Essence Moisturizing Lotion, Extra Dry Skin  Clairol Herbal Essence Moisturizing Lotion, Normal Skin  Curel Age Defying Therapeutic Moisturizing Lotion with Alpha Hydroxy  Curel Extreme Care Body Lotion  Curel Soothing Hands Moisturizing Hand Lotion  Curel Therapeutic Moisturizing Cream, Fragrance-Free  Curel Therapeutic Moisturizing Lotion, Fragrance-Free  Curel Therapeutic Moisturizing Lotion, Original Formula  Eucerin Daily Replenishing Lotion  Eucerin Dry Skin Therapy Plus Alpha Hydroxy Crme  Eucerin Dry Skin Therapy Plus Alpha Hydroxy Lotion  Eucerin Original Crme  Eucerin Original Lotion  Eucerin Plus Crme Eucerin Plus Lotion  Eucerin TriLipid Replenishing Lotion  Keri Anti-Bacterial Hand Lotion  Keri  Deep Conditioning Original Lotion Dry Skin Formula Softly Scented  Keri Deep Conditioning Original Lotion, Fragrance Free Sensitive Skin Formula  Keri Lotion Fast Absorbing Fragrance Free Sensitive Skin Formula  Keri Lotion Fast Absorbing Softly Scented Dry Skin Formula  Keri Original Lotion  Keri Skin Renewal Lotion Keri Silky Smooth Lotion  Keri Silky Smooth Sensitive Skin Lotion  Nivea Body Creamy Conditioning Oil  Nivea Body Extra Enriched Lotion  Nivea Body Original Lotion  Nivea Body Sheer Moisturizing Lotion Nivea Crme  Nivea Skin Firming Lotion  NutraDerm 30 Skin Lotion  NutraDerm Skin Lotion  NutraDerm Therapeutic Skin Cream  NutraDerm Therapeutic Skin Lotion  ProShield Protective Hand Cream  Provon moisturizing lotion  Please read over the following fact sheets that you were given.

## 2023-11-25 ENCOUNTER — Encounter (HOSPITAL_COMMUNITY)
Admission: RE | Admit: 2023-11-25 | Discharge: 2023-11-25 | Disposition: A | Discharge status: Home / Self Care | Source: Ambulatory Visit | Attending: Neurosurgery | Admitting: Neurosurgery

## 2023-11-25 ENCOUNTER — Other Ambulatory Visit: Payer: Self-pay

## 2023-11-25 ENCOUNTER — Encounter (HOSPITAL_COMMUNITY): Payer: Self-pay

## 2023-11-25 VITALS — BP 128/70 | HR 73 | Temp 97.7°F | Resp 17 | Ht 65.0 in | Wt 171.2 lb

## 2023-11-25 DIAGNOSIS — Z01812 Encounter for preprocedural laboratory examination: Secondary | ICD-10-CM | POA: Diagnosis present

## 2023-11-25 DIAGNOSIS — Z853 Personal history of malignant neoplasm of breast: Secondary | ICD-10-CM | POA: Diagnosis not present

## 2023-11-25 DIAGNOSIS — I10 Essential (primary) hypertension: Secondary | ICD-10-CM | POA: Insufficient documentation

## 2023-11-25 DIAGNOSIS — Z01818 Encounter for other preprocedural examination: Secondary | ICD-10-CM

## 2023-11-25 DIAGNOSIS — J4520 Mild intermittent asthma, uncomplicated: Secondary | ICD-10-CM | POA: Insufficient documentation

## 2023-11-25 DIAGNOSIS — I2510 Atherosclerotic heart disease of native coronary artery without angina pectoris: Secondary | ICD-10-CM | POA: Diagnosis not present

## 2023-11-25 HISTORY — DX: Pneumonia, unspecified organism: J18.9

## 2023-11-25 HISTORY — DX: Cardiac murmur, unspecified: R01.1

## 2023-11-25 HISTORY — DX: Headache, unspecified: R51.9

## 2023-11-25 HISTORY — DX: Dyspnea, unspecified: R06.00

## 2023-11-25 LAB — CBC
HCT: 46.5 % — ABNORMAL HIGH (ref 36.0–46.0)
Hemoglobin: 15.1 g/dL — ABNORMAL HIGH (ref 12.0–15.0)
MCH: 31.2 pg (ref 26.0–34.0)
MCHC: 32.5 g/dL (ref 30.0–36.0)
MCV: 96.1 fL (ref 80.0–100.0)
Platelets: 184 10*3/uL (ref 150–400)
RBC: 4.84 MIL/uL (ref 3.87–5.11)
RDW: 12.3 % (ref 11.5–15.5)
WBC: 6.7 10*3/uL (ref 4.0–10.5)
nRBC: 0 % (ref 0.0–0.2)

## 2023-11-25 LAB — BASIC METABOLIC PANEL
Anion gap: 7 (ref 5–15)
BUN: 13 mg/dL (ref 8–23)
CO2: 31 mmol/L (ref 22–32)
Calcium: 9.6 mg/dL (ref 8.9–10.3)
Chloride: 104 mmol/L (ref 98–111)
Creatinine, Ser: 0.77 mg/dL (ref 0.44–1.00)
GFR, Estimated: >60 mL/min (ref >60)
Glucose, Bld: 97 mg/dL (ref 70–99)
Potassium: 4.6 mmol/L (ref 3.5–5.1)
Sodium: 142 mmol/L (ref 135–145)

## 2023-11-25 LAB — TYPE AND SCREEN
ABO/RH(D): B POS
Antibody Screen: NEGATIVE

## 2023-11-25 LAB — SURGICAL PCR SCREEN
MRSA, PCR: NEGATIVE
Staphylococcus aureus: NEGATIVE

## 2023-11-25 NOTE — Progress Notes (Signed)
 PCP - Dr. Feliciana Rossetti Cardiologist - Dr. Gypsy Balsam - Last office visit 10/07/2023 Pulmonologist - Dr. Marcellus Scott  PPM/ICD - Denies Device Orders - n/a Rep Notified - n/a  Chest x-ray - n/a EKG - 10/11/2023 Stress Test - 09/18/2023 ECHO - 09/27/2023 Cardiac Cath - 10/16/2023  Sleep Study - Pts pulmonologist has recommended a sleep study, but pt declined at that time.  No DM  Last dose of GLP1 agonist- n/a  GLP1 instructions: n/a  Blood Thinner Instructions: n/a Aspirin Instructions: n/a  NPO after midnight  COVID TEST- n/a   Anesthesia review: Yes. Cardiac and Pulmonary clearance. Last office note requested from pulmonologist.   Patient denies shortness of breath, fever, cough and chest pain at PAT appointment. Pt denies any respiratory illness/infection in the last two months.   All instructions explained to the patient, with a verbal understanding of the material. Patient agrees to go over the instructions while at home for a better understanding. Patient also instructed to self quarantine after being tested for COVID-19. The opportunity to ask questions was provided.

## 2023-11-26 NOTE — Anesthesia Preprocedure Evaluation (Addendum)
 Anesthesia Evaluation  Patient identified by MRN, date of birth, ID band Patient awake    Reviewed: Allergy & Precautions, NPO status , Patient's Chart, lab work & pertinent test results  History of Anesthesia Complications Negative for: history of anesthetic complications  Airway Mallampati: III   Neck ROM: Full    Dental  (+) Dental Advisory Given   Pulmonary asthma , COPD   Pulmonary exam normal        Cardiovascular hypertension, Pt. on medications + CAD  Normal cardiovascular exam   '25 Cath - Mid LAD lesion is 20% stenosed.  '25 TTE - EF 60 to 65%. Grade I diastolic dysfunction (impaired relaxation). Mild mitral valve regurgitation.     Neuro/Psych  Headaches  Neuromuscular disease  negative psych ROS   GI/Hepatic negative GI ROS, Neg liver ROS,,,  Endo/Other  negative endocrine ROS    Renal/GU negative Renal ROS     Musculoskeletal  (+) Arthritis , Osteoarthritis,    Abdominal   Peds  Hematology negative hematology ROS (+)   Anesthesia Other Findings   Reproductive/Obstetrics  Breast cancer                              Anesthesia Physical Anesthesia Plan  ASA: 3  Anesthesia Plan: General   Post-op Pain Management: Tylenol PO (pre-op)*   Induction: Intravenous  PONV Risk Score and Plan: 3 and Treatment may vary due to age or medical condition, Ondansetron, Dexamethasone and Propofol infusion  Airway Management Planned: Oral ETT  Additional Equipment: None  Intra-op Plan:   Post-operative Plan: Extubation in OR  Informed Consent: I have reviewed the patients History and Physical, chart, labs and discussed the procedure including the risks, benefits and alternatives for the proposed anesthesia with the patient or authorized representative who has indicated his/her understanding and acceptance.     Dental advisory given  Plan Discussed with: CRNA and  Anesthesiologist  Anesthesia Plan Comments: (Pre and post-op nebulizer treatments per pulmonology recommendations )        Anesthesia Quick Evaluation

## 2023-11-26 NOTE — Progress Notes (Signed)
 Anesthesia Chart Review:  78 year old female with pertinent history including asthma, DOE, coronary calcifications, HTN, HLD, left breast cancer s/p remote lumpectomy and radiation.  Recently underwent thorough cardiology evaluation for DOE.  Echo showed normal EF and grade 1 DD.  False positive stress test led to catheterization showing mild nonobstructive disease.  Dr. Bing Matter cleared the patient 10/22/23 stating, "should be no problem to pursue surgical intervention, at low risk from cardiac standpoint."  Patient follows with pulmonologist Dr. Blenda Nicely for history of mild intermittent asthma and suspected OSA (patient declined sleep study).  She was seen 09/24/2023 for preop evaluation.  Per note, "spirometry performed in office today.  No obstruction.  Alpha-1 results discussed and are within normal limits.  Patient is at moderate risk for general surgery due to respiratory issues.  I think that the patient should be given a nebulizer treatment an hour before and also after anesthesia.  Patient should be on the lowest oxygen postsurgery to keep the pulse ox between 90 to 92%.  Patient may need BiPAP/CPAP after anesthesia.  I will be glad to assist you and please do not hesitate to call."  Preop labs reviewed, unremarkable.  EKG 10/11/2023: Sinus rhythm with occasional Premature ventricular complexes.  Rate 70. Septal infarct (cited on or before 09-Sep-2023)  Cath 10/16/2023: 1.  Minor irregularities with no evidence of obstructive coronary artery disease. 2.  Left ventricular angiography was not performed.  EF was normal by echo.  Normal left ventricular end-diastolic pressure.   Recommendations: Suspect falsely positive nuclear stress test. The patient is at low risk for back surgery from a cardiac standpoint.  TTE 09/27/2023:  1. Left ventricular ejection fraction, by estimation, is 60 to 65%. The  left ventricle has normal function. The left ventricle has no regional  wall motion  abnormalities. Left ventricular diastolic parameters are  consistent with Grade I diastolic  dysfunction (impaired relaxation). The average left ventricular global  longitudinal strain is 18.6 %. The global longitudinal strain is normal.   2. Right ventricular systolic function is normal. The right ventricular  size is normal. There is normal pulmonary artery systolic pressure.   3. The mitral valve is normal in structure. Mild mitral valve  regurgitation. No evidence of mitral stenosis.   4. The aortic valve is tricuspid. Aortic valve regurgitation is not  visualized. No aortic stenosis is present.   5. The inferior vena cava is normal in size with greater than 50%  respiratory variability, suggesting right atrial pressure of 3 mmHg.     Zannie Cove [facility] Short Stay Center/Anesthesiology Phone [phone] 11/26/2023 12:55 PM

## 2023-11-28 ENCOUNTER — Telehealth: Payer: Self-pay | Admitting: Cardiology

## 2023-11-28 NOTE — Telephone Encounter (Signed)
 Error

## 2023-12-04 ENCOUNTER — Ambulatory Visit (HOSPITAL_COMMUNITY)

## 2023-12-04 ENCOUNTER — Encounter (HOSPITAL_COMMUNITY): Payer: Self-pay | Admitting: Neurosurgery

## 2023-12-04 ENCOUNTER — Ambulatory Visit (HOSPITAL_COMMUNITY): Payer: Self-pay | Admitting: Physician Assistant

## 2023-12-04 ENCOUNTER — Inpatient Hospital Stay (HOSPITAL_COMMUNITY)
Admission: RE | Admit: 2023-12-04 | Discharge: 2023-12-06 | DRG: 402 | Disposition: A | Discharge status: Home / Self Care | Payer: PPO | Attending: Neurosurgery | Admitting: Neurosurgery

## 2023-12-04 ENCOUNTER — Ambulatory Visit (HOSPITAL_COMMUNITY): Payer: Self-pay

## 2023-12-04 ENCOUNTER — Other Ambulatory Visit: Payer: Self-pay

## 2023-12-04 ENCOUNTER — Inpatient Hospital Stay (HOSPITAL_COMMUNITY)
Admission: RE | Disposition: A | Discharge status: Home / Self Care | Payer: Self-pay | Source: Home / Self Care | Attending: Neurosurgery

## 2023-12-04 DIAGNOSIS — M5116 Intervertebral disc disorders with radiculopathy, lumbar region: Secondary | ICD-10-CM | POA: Diagnosis not present

## 2023-12-04 DIAGNOSIS — Z833 Family history of diabetes mellitus: Secondary | ICD-10-CM

## 2023-12-04 DIAGNOSIS — M48062 Spinal stenosis, lumbar region with neurogenic claudication: Secondary | ICD-10-CM | POA: Diagnosis not present

## 2023-12-04 DIAGNOSIS — Z8049 Family history of malignant neoplasm of other genital organs: Secondary | ICD-10-CM

## 2023-12-04 DIAGNOSIS — I2510 Atherosclerotic heart disease of native coronary artery without angina pectoris: Secondary | ICD-10-CM | POA: Diagnosis present

## 2023-12-04 DIAGNOSIS — Z9049 Acquired absence of other specified parts of digestive tract: Secondary | ICD-10-CM

## 2023-12-04 DIAGNOSIS — Z8249 Family history of ischemic heart disease and other diseases of the circulatory system: Secondary | ICD-10-CM

## 2023-12-04 DIAGNOSIS — I10 Essential (primary) hypertension: Secondary | ICD-10-CM | POA: Diagnosis present

## 2023-12-04 DIAGNOSIS — R112 Nausea with vomiting, unspecified: Secondary | ICD-10-CM | POA: Diagnosis not present

## 2023-12-04 DIAGNOSIS — Z853 Personal history of malignant neoplasm of breast: Secondary | ICD-10-CM

## 2023-12-04 DIAGNOSIS — J449 Chronic obstructive pulmonary disease, unspecified: Secondary | ICD-10-CM | POA: Diagnosis not present

## 2023-12-04 DIAGNOSIS — M4726 Other spondylosis with radiculopathy, lumbar region: Secondary | ICD-10-CM | POA: Diagnosis present

## 2023-12-04 DIAGNOSIS — Z9842 Cataract extraction status, left eye: Secondary | ICD-10-CM

## 2023-12-04 DIAGNOSIS — M4316 Spondylolisthesis, lumbar region: Principal | ICD-10-CM | POA: Diagnosis present

## 2023-12-04 DIAGNOSIS — Z9841 Cataract extraction status, right eye: Secondary | ICD-10-CM

## 2023-12-04 DIAGNOSIS — Z961 Presence of intraocular lens: Secondary | ICD-10-CM | POA: Diagnosis present

## 2023-12-04 DIAGNOSIS — Z79899 Other long term (current) drug therapy: Secondary | ICD-10-CM

## 2023-12-04 LAB — ABO/RH: ABO/RH(D): B POS

## 2023-12-04 SURGERY — POSTERIOR LUMBAR FUSION 1 LEVEL
Anesthesia: General | Site: Spine Lumbar

## 2023-12-04 MED ORDER — OXYCODONE HCL 5 MG PO TABS: 5.0000 mg | ORAL_TABLET | ORAL | Status: DC | PRN

## 2023-12-04 MED ORDER — THROMBIN 5000 UNITS EX SOLR: OROMUCOSAL | Status: DC | PRN

## 2023-12-04 MED ORDER — PHENYLEPHRINE 80 MCG/ML (10ML) SYRINGE FOR IV PUSH (FOR BLOOD PRESSURE SUPPORT)
PREFILLED_SYRINGE | INTRAVENOUS | Status: DC | PRN
Start: 2023-12-04 — End: 2023-12-04
  Administered 2023-12-04 (×2): 160 ug via INTRAVENOUS

## 2023-12-04 MED ORDER — IPRATROPIUM-ALBUTEROL 0.5-2.5 (3) MG/3ML IN SOLN
3.0000 mL | Freq: Once | RESPIRATORY_TRACT | Status: AC
  Administered 2023-12-04: 3 mL via RESPIRATORY_TRACT
  Filled 2023-12-04: qty 3

## 2023-12-04 MED ORDER — THROMBIN 5000 UNITS EX KIT
PACK | CUTANEOUS | Status: AC
  Filled 2023-12-04: qty 1

## 2023-12-04 MED ORDER — ACETAMINOPHEN 500 MG PO TABS
1000.0000 mg | ORAL_TABLET | Freq: Once | ORAL | Status: AC
  Administered 2023-12-04: 1000 mg via ORAL
  Filled 2023-12-04: qty 2

## 2023-12-04 MED ORDER — SODIUM CHLORIDE 0.9 % IV SOLN
250.0000 mL | INTRAVENOUS | Status: AC
Start: 2023-12-04 — End: 2023-12-05

## 2023-12-04 MED ORDER — LATANOPROST 0.005 % OP SOLN
1.0000 [drp] | Freq: Every day | OPHTHALMIC | Status: DC
  Filled 2023-12-04: qty 2.5

## 2023-12-04 MED ORDER — HYDROMORPHONE HCL 1 MG/ML IJ SOLN
0.2500 mg | INTRAMUSCULAR | Status: DC | PRN
  Administered 2023-12-04 (×2): 0.5 mg via INTRAVENOUS

## 2023-12-04 MED ORDER — CHLORHEXIDINE GLUCONATE CLOTH 2 % EX PADS: 6.0000 | MEDICATED_PAD | Freq: Once | CUTANEOUS | Status: DC

## 2023-12-04 MED ORDER — LIDOCAINE 2% (20 MG/ML) 5 ML SYRINGE
INTRAMUSCULAR | Status: AC
  Filled 2023-12-04: qty 5

## 2023-12-04 MED ORDER — VASHE WOUND IRRIGATION OPTIME
TOPICAL | Status: DC | PRN
  Administered 2023-12-04: 34 [oz_av] via TOPICAL

## 2023-12-04 MED ORDER — ROCURONIUM BROMIDE 10 MG/ML (PF) SYRINGE
PREFILLED_SYRINGE | INTRAVENOUS | Status: AC
  Filled 2023-12-04: qty 10

## 2023-12-04 MED ORDER — BUPIVACAINE LIPOSOME 1.3 % IJ SUSP
INTRAMUSCULAR | Status: DC | PRN
  Administered 2023-12-04: 20 mL

## 2023-12-04 MED ORDER — FLUOROMETHOLONE 0.25 % OP SUSP
1.0000 [drp] | Freq: Every day | OPHTHALMIC | Status: DC
  Filled 2023-12-04: qty 5

## 2023-12-04 MED ORDER — DEXMEDETOMIDINE HCL IN NACL 80 MCG/20ML IV SOLN
INTRAVENOUS | Status: AC
  Filled 2023-12-04: qty 20

## 2023-12-04 MED ORDER — ONDANSETRON HCL 4 MG/2ML IJ SOLN
INTRAMUSCULAR | Status: DC | PRN
  Administered 2023-12-04: 4 mg via INTRAVENOUS

## 2023-12-04 MED ORDER — ZOLPIDEM TARTRATE 5 MG PO TABS: 5.0000 mg | ORAL_TABLET | Freq: Every evening | ORAL | Status: DC | PRN

## 2023-12-04 MED ORDER — LACTATED RINGERS IV SOLN: INTRAVENOUS | Status: DC

## 2023-12-04 MED ORDER — PROPYLENE GLYCOL (PF) 0.6 % OP SOLN: 1.0000 [drp] | Freq: Three times a day (TID) | OPHTHALMIC | Status: DC | PRN

## 2023-12-04 MED ORDER — SUGAMMADEX SODIUM 200 MG/2ML IV SOLN
INTRAVENOUS | Status: DC | PRN
  Administered 2023-12-04: 150 mg via INTRAVENOUS

## 2023-12-04 MED ORDER — DEXMEDETOMIDINE HCL IN NACL 80 MCG/20ML IV SOLN
INTRAVENOUS | Status: DC | PRN
  Administered 2023-12-04: 8 ug via INTRAVENOUS

## 2023-12-04 MED ORDER — BUPIVACAINE LIPOSOME 1.3 % IJ SUSP
INTRAMUSCULAR | Status: AC
  Filled 2023-12-04: qty 20

## 2023-12-04 MED ORDER — CEFAZOLIN SODIUM-DEXTROSE 2-4 GM/100ML-% IV SOLN
2.0000 g | Freq: Three times a day (TID) | INTRAVENOUS | Status: AC
  Administered 2023-12-04 – 2023-12-05 (×2): 2 g via INTRAVENOUS
  Filled 2023-12-04 (×2): qty 100

## 2023-12-04 MED ORDER — ONDANSETRON HCL 4 MG/2ML IJ SOLN
4.0000 mg | Freq: Four times a day (QID) | INTRAMUSCULAR | Status: DC | PRN
  Administered 2023-12-04 – 2023-12-05 (×3): 4 mg via INTRAVENOUS
  Filled 2023-12-04 (×3): qty 2

## 2023-12-04 MED ORDER — ROCURONIUM BROMIDE 10 MG/ML (PF) SYRINGE
PREFILLED_SYRINGE | INTRAVENOUS | Status: DC | PRN
  Administered 2023-12-04: 70 mg via INTRAVENOUS

## 2023-12-04 MED ORDER — CHLORHEXIDINE GLUCONATE 0.12 % MT SOLN
15.0000 mL | Freq: Once | OROMUCOSAL | Status: AC
  Administered 2023-12-04: 15 mL via OROMUCOSAL
  Filled 2023-12-04: qty 15

## 2023-12-04 MED ORDER — PHENOL 1.4 % MT LIQD: 1.0000 | OROMUCOSAL | Status: DC | PRN

## 2023-12-04 MED ORDER — DEXAMETHASONE SODIUM PHOSPHATE 10 MG/ML IJ SOLN
INTRAMUSCULAR | Status: AC
  Filled 2023-12-04: qty 1

## 2023-12-04 MED ORDER — BACITRACIN ZINC 500 UNIT/GM EX OINT
TOPICAL_OINTMENT | CUTANEOUS | Status: AC
  Filled 2023-12-04: qty 28.35

## 2023-12-04 MED ORDER — BACITRACIN ZINC 500 UNIT/GM EX OINT
TOPICAL_OINTMENT | CUTANEOUS | Status: DC | PRN
  Administered 2023-12-04: 1 via TOPICAL

## 2023-12-04 MED ORDER — OXYCODONE HCL 5 MG PO TABS: 5.0000 mg | ORAL_TABLET | Freq: Once | ORAL | Status: DC | PRN

## 2023-12-04 MED ORDER — AMLODIPINE BESYLATE 5 MG PO TABS
5.0000 mg | ORAL_TABLET | Freq: Every day | ORAL | Status: DC
  Administered 2023-12-05 – 2023-12-06 (×2): 5 mg via ORAL
  Filled 2023-12-04 (×2): qty 1

## 2023-12-04 MED ORDER — DEXAMETHASONE SODIUM PHOSPHATE 10 MG/ML IJ SOLN
INTRAMUSCULAR | Status: DC | PRN
  Administered 2023-12-04: 10 mg via INTRAVENOUS

## 2023-12-04 MED ORDER — ACETAMINOPHEN 325 MG PO TABS: 650.0000 mg | ORAL_TABLET | ORAL | Status: DC | PRN

## 2023-12-04 MED ORDER — POLYETHYLENE GLYCOL 3350 17 G PO PACK: 17.0000 g | PACK | Freq: Every morning | ORAL | Status: DC

## 2023-12-04 MED ORDER — SODIUM CHLORIDE 0.9% FLUSH
3.0000 mL | Freq: Two times a day (BID) | INTRAVENOUS | Status: DC
  Administered 2023-12-05 – 2023-12-06 (×2): 3 mL via INTRAVENOUS

## 2023-12-04 MED ORDER — OXYCODONE HCL 5 MG/5ML PO SOLN: 5.0000 mg | Freq: Once | ORAL | Status: DC | PRN

## 2023-12-04 MED ORDER — SODIUM CHLORIDE 0.9% FLUSH: 3.0000 mL | INTRAVENOUS | Status: DC | PRN

## 2023-12-04 MED ORDER — ONDANSETRON HCL 4 MG/2ML IJ SOLN
INTRAMUSCULAR | Status: AC
  Filled 2023-12-04: qty 2

## 2023-12-04 MED ORDER — FENTANYL CITRATE (PF) 100 MCG/2ML IJ SOLN
INTRAMUSCULAR | Status: AC
  Filled 2023-12-04: qty 2

## 2023-12-04 MED ORDER — LIDOCAINE 2% (20 MG/ML) 5 ML SYRINGE
INTRAMUSCULAR | Status: DC | PRN
  Administered 2023-12-04: 80 mg via INTRAVENOUS

## 2023-12-04 MED ORDER — OXYCODONE HCL 5 MG PO TABS
10.0000 mg | ORAL_TABLET | ORAL | Status: DC | PRN
  Administered 2023-12-05: 10 mg via ORAL
  Filled 2023-12-04: qty 2

## 2023-12-04 MED ORDER — PROPOFOL 500 MG/50ML IV EMUL
INTRAVENOUS | Status: DC | PRN
  Administered 2023-12-04: 25 ug/kg/min via INTRAVENOUS

## 2023-12-04 MED ORDER — BUPIVACAINE-EPINEPHRINE (PF) 0.5% -1:200000 IJ SOLN
INTRAMUSCULAR | Status: AC
  Filled 2023-12-04: qty 30

## 2023-12-04 MED ORDER — ALBUMIN HUMAN 5 % IV SOLN: INTRAVENOUS | Status: DC | PRN

## 2023-12-04 MED ORDER — LORATADINE 10 MG PO TABS
10.0000 mg | ORAL_TABLET | Freq: Every day | ORAL | Status: DC
  Administered 2023-12-06: 10 mg via ORAL
  Filled 2023-12-04 (×3): qty 1

## 2023-12-04 MED ORDER — BUPIVACAINE-EPINEPHRINE (PF) 0.5% -1:200000 IJ SOLN
INTRAMUSCULAR | Status: DC | PRN
  Administered 2023-12-04: 10 mL

## 2023-12-04 MED ORDER — LACTATED RINGERS IV SOLN: INTRAVENOUS | Status: DC | PRN

## 2023-12-04 MED ORDER — AMISULPRIDE (ANTIEMETIC) 5 MG/2ML IV SOLN
INTRAVENOUS | Status: AC
  Filled 2023-12-04: qty 4

## 2023-12-04 MED ORDER — PHENYLEPHRINE HCL-NACL 20-0.9 MG/250ML-% IV SOLN: INTRAVENOUS | Status: DC | PRN

## 2023-12-04 MED ORDER — MENTHOL 3 MG MT LOZG: 1.0000 | LOZENGE | OROMUCOSAL | Status: DC | PRN

## 2023-12-04 MED ORDER — ONDANSETRON HCL 4 MG/2ML IJ SOLN: 4.0000 mg | Freq: Once | INTRAMUSCULAR | Status: DC | PRN

## 2023-12-04 MED ORDER — FENTANYL CITRATE (PF) 250 MCG/5ML IJ SOLN
INTRAMUSCULAR | Status: DC | PRN
Start: 2023-12-04 — End: 2023-12-04
  Administered 2023-12-04: 50 ug via INTRAVENOUS
  Administered 2023-12-04: 25 ug via INTRAVENOUS
  Administered 2023-12-04: 100 ug via INTRAVENOUS

## 2023-12-04 MED ORDER — 0.9 % SODIUM CHLORIDE (POUR BTL) OPTIME
TOPICAL | Status: DC | PRN
  Administered 2023-12-04: 1000 mL

## 2023-12-04 MED ORDER — CYCLOBENZAPRINE HCL 10 MG PO TABS
10.0000 mg | ORAL_TABLET | Freq: Three times a day (TID) | ORAL | Status: DC | PRN
  Administered 2023-12-05 – 2023-12-06 (×3): 10 mg via ORAL
  Filled 2023-12-04 (×3): qty 1

## 2023-12-04 MED ORDER — FENTANYL CITRATE (PF) 250 MCG/5ML IJ SOLN
INTRAMUSCULAR | Status: AC
  Filled 2023-12-04: qty 5

## 2023-12-04 MED ORDER — MORPHINE SULFATE (PF) 2 MG/ML IV SOLN
2.0000 mg | INTRAVENOUS | Status: DC | PRN
  Administered 2023-12-04 – 2023-12-05 (×2): 2 mg via INTRAVENOUS
  Filled 2023-12-04 (×2): qty 1

## 2023-12-04 MED ORDER — PROPOFOL 10 MG/ML IV BOLUS
INTRAVENOUS | Status: DC | PRN
  Administered 2023-12-04: 150 mg via INTRAVENOUS

## 2023-12-04 MED ORDER — HYDROMORPHONE HCL 1 MG/ML IJ SOLN
INTRAMUSCULAR | Status: AC
  Filled 2023-12-04: qty 1

## 2023-12-04 MED ORDER — ONDANSETRON HCL 4 MG PO TABS
4.0000 mg | ORAL_TABLET | Freq: Four times a day (QID) | ORAL | Status: DC | PRN
  Filled 2023-12-04: qty 1

## 2023-12-04 MED ORDER — ORAL CARE MOUTH RINSE: 15.0000 mL | Freq: Once | OROMUCOSAL | Status: AC

## 2023-12-04 MED ORDER — ACETAMINOPHEN 650 MG RE SUPP: 650.0000 mg | RECTAL | Status: DC | PRN

## 2023-12-04 MED ORDER — ACETAMINOPHEN 500 MG PO TABS
1000.0000 mg | ORAL_TABLET | Freq: Four times a day (QID) | ORAL | Status: AC
  Administered 2023-12-05: 1000 mg via ORAL
  Filled 2023-12-04 (×2): qty 2

## 2023-12-04 MED ORDER — CEFAZOLIN SODIUM-DEXTROSE 2-4 GM/100ML-% IV SOLN
2.0000 g | INTRAVENOUS | Status: AC
  Administered 2023-12-04: 2 g via INTRAVENOUS
  Filled 2023-12-04: qty 100

## 2023-12-04 MED ORDER — PHENYLEPHRINE 80 MCG/ML (10ML) SYRINGE FOR IV PUSH (FOR BLOOD PRESSURE SUPPORT)
PREFILLED_SYRINGE | INTRAVENOUS | Status: AC
  Filled 2023-12-04: qty 10

## 2023-12-04 MED ORDER — AMISULPRIDE (ANTIEMETIC) 5 MG/2ML IV SOLN
10.0000 mg | Freq: Once | INTRAVENOUS | Status: AC | PRN
  Administered 2023-12-04: 10 mg via INTRAVENOUS

## 2023-12-04 MED ORDER — DOCUSATE SODIUM 100 MG PO CAPS
100.0000 mg | ORAL_CAPSULE | Freq: Two times a day (BID) | ORAL | Status: DC
  Administered 2023-12-05 – 2023-12-06 (×3): 100 mg via ORAL
  Filled 2023-12-04 (×3): qty 1

## 2023-12-04 MED ORDER — PHENYLEPHRINE HCL-NACL 20-0.9 MG/250ML-% IV SOLN
INTRAVENOUS | Status: DC | PRN
  Administered 2023-12-04: 20 ug/min via INTRAVENOUS

## 2023-12-04 MED ORDER — FENTANYL CITRATE (PF) 100 MCG/2ML IJ SOLN
25.0000 ug | INTRAMUSCULAR | Status: DC | PRN
  Administered 2023-12-04 (×3): 25 ug via INTRAVENOUS

## 2023-12-04 MED ORDER — BISACODYL 10 MG RE SUPP: 10.0000 mg | Freq: Every day | RECTAL | Status: DC | PRN

## 2023-12-04 SURGICAL SUPPLY — 60 items
BAG COUNTER SPONGE SURGICOUNT (BAG) ×1 IMPLANT
BASKET BONE COLLECTION (BASKET) ×1 IMPLANT
BENZOIN TINCTURE PRP APPL 2/3 (GAUZE/BANDAGES/DRESSINGS) ×1 IMPLANT
BLADE CLIPPER SURG (BLADE) IMPLANT
BUR MATCHSTICK NEURO 3.0 LAGG (BURR) ×1 IMPLANT
BUR PRECISION FLUTE 6.0 (BURR) ×1 IMPLANT
CANISTER SUCT 3000ML PPV (MISCELLANEOUS) ×1 IMPLANT
CAP LOCK DLX THRD (Cap) IMPLANT
CLEANSER WND VASHE INSTL 34OZ (WOUND CARE) ×1 IMPLANT
CNTNR URN SCR LID CUP LEK RST (MISCELLANEOUS) ×1 IMPLANT
COVER BACK TABLE 60X90IN (DRAPES) ×1 IMPLANT
DRAPE C-ARM 42X72 X-RAY (DRAPES) ×2 IMPLANT
DRAPE HALF SHEET 40X57 (DRAPES) ×1 IMPLANT
DRAPE LAPAROTOMY 100X72X124 (DRAPES) ×1 IMPLANT
DRAPE SURG 17X23 STRL (DRAPES) ×1 IMPLANT
DRSG OPSITE POSTOP 4X6 (GAUZE/BANDAGES/DRESSINGS) ×1 IMPLANT
DRSG OPSITE POSTOP 4X8 (GAUZE/BANDAGES/DRESSINGS) IMPLANT
ELECT BLADE 4.0 EZ CLEAN MEGAD (MISCELLANEOUS) ×1 IMPLANT
ELECT REM PT RETURN 9FT ADLT (ELECTROSURGICAL) ×1 IMPLANT
ELECTRODE BLDE 4.0 EZ CLN MEGD (MISCELLANEOUS) ×1 IMPLANT
ELECTRODE REM PT RTRN 9FT ADLT (ELECTROSURGICAL) ×1 IMPLANT
EVACUATOR 1/8 PVC DRAIN (DRAIN) IMPLANT
GAUZE 4X4 16PLY ~~LOC~~+RFID DBL (SPONGE) ×1 IMPLANT
GLOVE BIO SURGEON STRL SZ 6 (GLOVE) ×1 IMPLANT
GLOVE BIO SURGEON STRL SZ8 (GLOVE) ×2 IMPLANT
GLOVE BIO SURGEON STRL SZ8.5 (GLOVE) ×2 IMPLANT
GLOVE BIOGEL PI IND STRL 6.5 (GLOVE) ×1 IMPLANT
GLOVE EXAM NITRILE XL STR (GLOVE) IMPLANT
GOWN STRL REUS W/ TWL LRG LVL3 (GOWN DISPOSABLE) ×1 IMPLANT
GOWN STRL REUS W/ TWL XL LVL3 (GOWN DISPOSABLE) ×2 IMPLANT
GOWN STRL REUS W/TWL 2XL LVL3 (GOWN DISPOSABLE) IMPLANT
HEMOSTAT POWDER KIT SURGIFOAM (HEMOSTASIS) ×1 IMPLANT
KIT BASIN OR (CUSTOM PROCEDURE TRAY) ×1 IMPLANT
KIT GRAFTMAG DEL NEURO DISP (NEUROSURGERY SUPPLIES) IMPLANT
KIT POSITION SURG JACKSON T1 (MISCELLANEOUS) ×1 IMPLANT
KIT TURNOVER KIT B (KITS) ×1 IMPLANT
NDL HYPO 21X1.5 SAFETY (NEEDLE) ×1 IMPLANT
NDL HYPO 22X1.5 SAFETY MO (MISCELLANEOUS) ×1 IMPLANT
NEEDLE HYPO 21X1.5 SAFETY (NEEDLE) ×1 IMPLANT
NEEDLE HYPO 22X1.5 SAFETY MO (MISCELLANEOUS) ×1 IMPLANT
NS IRRIG 1000ML POUR BTL (IV SOLUTION) ×1 IMPLANT
PACK LAMINECTOMY NEURO (CUSTOM PROCEDURE TRAY) ×1 IMPLANT
PAD ARMBOARD POSITIONER FOAM (MISCELLANEOUS) ×3 IMPLANT
PATTIES SURGICAL .5 X1 (DISPOSABLE) IMPLANT
PUTTY DBM 10CC CALC GRAN (Putty) IMPLANT
ROD CREO DLX CVD 6.35X40 (Rod) IMPLANT
SCREW PA DLX CREO 7.5X50 (Screw) IMPLANT
SPACER ALTERA 10X31 9-13MM-8 (Spacer) IMPLANT
SPIKE FLUID TRANSFER (MISCELLANEOUS) ×1 IMPLANT
SPONGE NEURO XRAY DETECT 1X3 (DISPOSABLE) IMPLANT
SPONGE SURGIFOAM ABS GEL 100 (HEMOSTASIS) IMPLANT
SPONGE T-LAP 4X18 ~~LOC~~+RFID (SPONGE) IMPLANT
STRIP CLOSURE SKIN 1/2X4 (GAUZE/BANDAGES/DRESSINGS) ×1 IMPLANT
SUT VIC AB 1 CT1 18XBRD ANBCTR (SUTURE) ×2 IMPLANT
SUT VIC AB 2-0 CP2 18 (SUTURE) ×2 IMPLANT
SYR 20ML LL LF (SYRINGE) IMPLANT
TOWEL GREEN STERILE (TOWEL DISPOSABLE) ×1 IMPLANT
TOWEL GREEN STERILE FF (TOWEL DISPOSABLE) ×1 IMPLANT
TRAY FOLEY MTR SLVR 16FR STAT (SET/KITS/TRAYS/PACK) ×1 IMPLANT
WATER STERILE IRR 1000ML POUR (IV SOLUTION) ×1 IMPLANT

## 2023-12-04 NOTE — H&P (Signed)
 Subjective: The patient is a 78 year old white female who is complaining of back and left leg pain consistent with neurogenic claudication.  She has failed medical management and was worked up with a lumbar MRI and lumbar x-rays which demonstrated an L4-5 spondylolisthesis and spinal stenosis.  We discussed the various treatment options.  She has decided proceed with surgery.    Past Medical History:  Diagnosis Date   Abnormal CXR 10/06/2018   Formatting of this note might be different from the original. She had abnormal CXR that showed COPD but patient does not think she does though she has had second hand smoke exposure, discussed with her recommendation for PFTs she wants to wait on this   Calcification of coronary artery 05/09/2022   Cancer (HCC)    Breast Cancer - Left   Chronic constipation 11/30/2019   Collagen vascular disease (HCC)    COPD suggested by initial evaluation (HCC) 06/22/2019   Dyslipidemia 05/08/2016   Dyspnea    related to increased back pain   Dyspnea on exertion 03/10/2020   Elevated blood-pressure reading without diagnosis of hypertension 10/06/2018   Epistaxis, recurrent 02/19/2022   Essential hypertension 01/12/2022   Headache    Hx of migraines in 20s   Heart murmur    History of breast cancer 12/31/2019   Hypertension    Malaise and fatigue 01/20/2016   Last Assessment & Plan:  Formatting of this note might be different from the original. Relevant Hx: Course: Daily Update: Today's Plan:update her labs for her and discussed with her energy levels and her sleep which is stable overall  Electronically signed by: Krystal Clark, NP 01/23/16 [phone]   Mitral valve prolapse 01/20/2016   Last Assessment & Plan:  Relevant Hx: Course: Daily Update: Today's Plan:she follows with Dr. Bing Matter for her MVP and her [place] was done about year ago and it was stable for her  Electronically signed by: Krystal Clark, NP 01/23/16 0835   Mixed hyperlipidemia  01/20/2016   Last Assessment & Plan:  Relevant Hx: Course: Daily Update: Today's Plan:she is fasting for this and has been trying to watch her diet  Electronically signed by: Krystal Clark, NP 01/23/16 0832   Neuropathy    Osteopenia 01/20/2016   Last Assessment & Plan:  Formatting of this note might be different from the original. Relevant Hx: Course: Daily Update: Today's Plan:she is taking Bone Strength for her bones and she still follows with her GYN for her womens health and it was this year and she was advised fosamax but refuses, Dr. Rana Snare is her provider.  Electronically signed by: Krystal Clark, NP 01/23/16 [phone]   Personal history of radiation therapy    Pneumonia    as a child   Primary osteoarthritis involving multiple joints 01/23/2016   Last Assessment & Plan:  Formatting of this note might be different from the original. Relevant Hx: Course: Daily Update: Today's Plan:she uses the ibuprofen and is careful with this and would like to have the prescription strength version of this as is easier.  Electronically signed by: Krystal Clark, NP 01/23/16 [phone]   Thyroid nodule 07/01/2019    Past Surgical History:  Procedure Laterality Date   BREAST LUMPECTOMY Left 1999   CARPAL TUNNEL RELEASE Bilateral 2007   CATARACT EXTRACTION W/ INTRAOCULAR LENS IMPLANT Bilateral    CHOLECYSTECTOMY  2008   COLONOSCOPY     2020s - Polyps removed and benign   LEFT HEART CATH AND CORONARY ANGIOGRAPHY N/A  10/16/2023   Procedure: LEFT HEART CATH AND CORONARY ANGIOGRAPHY;  Surgeon: Iran Ouch, MD;  Location: [facility] INVASIVE CV LAB;  Service: Cardiovascular;  Laterality: N/A;   TONSILLECTOMY  1959    Allergies  Allergen Reactions   Pravastatin Other (See Comments)    Severe Nose Bleed     Social History   Tobacco Use   Smoking status: Never   Smokeless tobacco: Never  Substance Use Topics   Alcohol use: Not Currently    Family History  Problem Relation  Age of Onset   Cervical cancer Mother    Diabetes Father    Hypertension Father    Colon cancer Neg Hx    Esophageal cancer Neg Hx    Rectal cancer Neg Hx    Stomach cancer Neg Hx    Breast cancer Neg Hx    Prior to Admission medications   Medication Sig Start Date End Date Taking? Authorizing Provider  acetaminophen (TYLENOL) 500 MG tablet Take 500-1,000 mg by mouth every 6 (six) hours as needed (pain.).   Yes [provider]  amLODipine (NORVASC) 5 MG tablet Take 1 tablet (5 mg total) by mouth daily. 01/24/23  Yes Georgeanna Lea, MD  cetirizine (ZYRTEC) 5 MG tablet Take 5 mg by mouth daily. At night   Yes [provider]  cyanocobalamin (VITAMIN B12) 1000 MCG tablet Take 1,000 mcg by mouth daily.   Yes [provider]  fluorometholone (FML FORTE) 0.25 % ophthalmic suspension Place 1 drop into the left eye daily. Taking for blocked tear duct   Yes [provider]  ibuprofen (ADVIL) 800 MG tablet Take 800 mg by mouth daily as needed (bad back pain.).   Yes [provider]  latanoprost (XALATAN) 0.005 % ophthalmic solution Place 1 drop into both eyes at bedtime. 05/04/19  Yes [provider]  Methylsulfonylmethane (MSM PO) Take 1,000 mg by mouth in the morning.   Yes [provider]  Multiple Vitamins-Minerals (ALGAE BASED CALCIUM PO) Take 1,000 mg by mouth in the morning and at bedtime.   Yes [provider]  Multiple Vitamins-Minerals (PRESERVISION AREDS 2) CAPS Take 1 capsule by mouth in the morning and at bedtime.   Yes [provider]  polyethylene glycol (MIRALAX / GLYCOLAX) 17 g packet Take 17 g by mouth in the morning.   Yes [provider]  Propylene Glycol, PF, (SYSTANE COMPLETE PF) 0.6 % SOLN Place 1 drop into both eyes 3 (three) times daily as needed (dry eyes).   Yes [provider]  BLACK COHOSH EXTRACT PO Take 100 mg by mouth daily as needed (Summer time only / hot flashes).     [provider]     Review of Systems  Positive ROS: As above  All other systems have been reviewed and were otherwise negative with the exception of those mentioned in the HPI and as above.  Objective: Vital signs in last 24 hours: Temp:  [98 F (36.7 C)] 98 F (36.7 C) (03/26 1050) Pulse Rate:  [79] 79 (03/26 1050) Resp:  [18] 18 (03/26 1050) BP: (141)/(83) 141/83 (03/26 1050) SpO2:  [95 %] 95 % (03/26 1050) Weight:  [74.8 kg] 74.8 kg (03/26 1050) Estimated body mass index is 27.46 kg/m as calculated from the following:   Height as of this encounter: 5\' 5"  (1.651 m).   Weight as of this encounter: 74.8 kg.   General Appearance: Alert Head: Normocephalic, without obvious abnormality, atraumatic Eyes: PERRL, conjunctiva/corneas clear, EOM's  intact,    Ears: Normal  Throat: Normal  Neck: Supple, Back: unremarkable Lungs: Clear to auscultation bilaterally, respirations unlabored Heart: Regular rate and rhythm, no murmur, rub or gallop Abdomen: Soft, non-tender Extremities: Extremities normal, atraumatic, no cyanosis or edema Skin: unremarkable  NEUROLOGIC:   Mental status: alert and oriented,Motor Exam - grossly normal Sensory Exam - grossly normal Reflexes:  Coordination - grossly normal Gait - grossly normal Balance - grossly normal Cranial Nerves: I: smell Not tested  II: visual acuity  OS: Normal  OD: Normal   II: visual fields Full to confrontation  II: pupils Equal, round, reactive to light  III,VII: ptosis None  III,IV,VI: extraocular muscles  Full ROM  V: mastication Normal  V: facial light touch sensation  Normal  V,VII: corneal reflex  Present  VII: facial muscle function - upper  Normal  VII: facial muscle function - lower Normal  VIII: hearing Not tested  IX: soft palate elevation  Normal  IX,X: gag reflex Present  XI: trapezius strength  5/5  XI: sternocleidomastoid strength 5/5  XI: neck flexion strength  5/5  XII: tongue strength   Normal    Data Review Lab Results  Component Value Date   WBC 6.7 11/25/2023   HGB 15.1 (H) 11/25/2023   HCT 46.5 (H) 11/25/2023   MCV 96.1 11/25/2023   PLT 184 11/25/2023   Lab Results  Component Value Date   NA 142 11/25/2023   K 4.6 11/25/2023   CL 104 11/25/2023   CO2 31 11/25/2023   BUN 13 11/25/2023   CREATININE 0.77 11/25/2023   GLUCOSE 97 11/25/2023   No results found for: "INR", "PROTIME"  Assessment/Plan: Lumbar spondylolisthesis, lumbar spinal stenosis, lumbar radiculopathy, neurogenic claudication: I discussed the situation with the patient.  I reviewed her imaging studies with her and pointed out the abnormalities.  We have discussed the various treatment options including surgery.  I have described the surgical treatment option of an L4-5 decompression, instrumentation and fusion.  I have shown her surgical models.  I have given her a surgical pamphlet.  We have discussed the risk, benefits, alternatives, expected postoperative course, and likelihood of achieving our goals with surgery.  I have answered all her questions.  She has decided proceed with surgery.   Cristi Loron 12/04/2023 1:52 PM

## 2023-12-04 NOTE — Progress Notes (Signed)
 Orthopedic Tech Progress Note Patient Details:  Deanna Walsh 1946/08/31 [id number]  Ortho Devices Type of Ortho Device: Lumbar corsett Ortho Device/Splint Location: back Ortho Device/Splint Interventions: Ordered   Post Interventions Patient Tolerated: Well Instructions Provided: Care of device  Donald Pore 12/04/2023, 7:53 PM

## 2023-12-04 NOTE — Transfer of Care (Signed)
 Immediate Anesthesia Transfer of Care Note  Patient: TANASIA BUDZINSKI  Procedure(s) Performed: LUMBAR FOUR-FIVE POSTERIOR LUMBAR INTERBODY FUSION (Spine Lumbar)  Patient Location: PACU  Anesthesia Type:General  Level of Consciousness: drowsy  Airway & Oxygen Therapy: Patient Spontanous Breathing and Patient connected to nasal cannula oxygen  Post-op Assessment: Report given to RN and Post -op Vital signs reviewed and stable  Post vital signs: Reviewed and stable  Last Vitals:  Vitals Value Taken Time  BP 105/63 12/04/23 1745  Temp 36.4 C 12/04/23 1742  Pulse 79 12/04/23 1746  Resp 14 12/04/23 1746  SpO2 94 % 12/04/23 1746  Vitals shown include unfiled device data.  Last Pain:  Vitals:   12/04/23 1121  TempSrc:   PainSc: 2       Patients Stated Pain Goal: 3 (12/04/23 1119)  Complications: No notable events documented.

## 2023-12-04 NOTE — Anesthesia Procedure Notes (Signed)
 Date/Time: 12/04/2023 2:31 PM  Performed by: Herbie Drape, CRNAPre-anesthesia Checklist: Patient identified, Emergency Drugs available, Suction available, Patient being monitored and Timeout performed Patient Re-evaluated:Patient Re-evaluated prior to induction Oxygen Delivery Method: Circle system utilized Preoxygenation: Pre-oxygenation with 100% oxygen Induction Type: IV induction Ventilation: Mask ventilation without difficulty Laryngoscope Size: Miller and 2 Grade View: Grade II Number of attempts: 1 Airway Equipment and Method: Patient positioned with wedge pillow Placement Confirmation: ETT inserted through vocal cords under direct vision, positive ETCO2, CO2 detector and breath sounds checked- equal and bilateral Secured at: 22 cm Tube secured with: Tape Dental Injury: Teeth and Oropharynx as per pre-operative assessment  Comments: Grade 2b view with miller #2 secured 22" gums  Soft airway

## 2023-12-04 NOTE — Op Note (Signed)
 Brief history: The patient is a 78 year old white female who is complaining of back and left leg pain consistent with lumbar radiculopathy/neurogenic claudication.  She has failed medical management and was worked up with lumbar x-rays and lumbar MRI which demonstrated an L4-5 spondylolisthesis, foraminal stenosis, and spinal stenosis.  I discussed the various treatment options with her.  She has decided proceed with surgery.  Preoperative diagnosis: L4-5 spondylolisthesis, foraminal stenosis, degenerative disc disease, spinal stenosis compressing both the L4 and the L5 nerve roots; lumbago; lumbar radiculopathy; neurogenic claudication  Postoperative diagnosis: The same  Procedure: Bilateral L4-5 laminotomy/foraminotomies/medial facetectomy to decompress the bilateral L4 and L5 nerve roots(the work required to do this was in addition to the work required to do the posterior lumbar interbody fusion because of the patient's spinal stenosis, facet arthropathy. Etc. requiring a wide decompression of the nerve roots.); left L4-5 transforaminal lumbar interbody fusion with local morselized autograft bone and Zimmer DBM; insertion of interbody prosthesis at L4-5 (globus peek expandable interbody prosthesis); posterior nonsegmental instrumentation from L4 to L5 with globus titanium pedicle screws and rods; posterior lateral arthrodesis at L4-5 with local morselized autograft bone and Zimmer DBM.  Surgeon: Dr. Delma Officer  Asst.: Dr. Coletta Memos and Hildred Priest, NP  Anesthesia: Gen. endotracheal  Estimated blood loss: 200 cc  Drains: 1 medium Hemovac drain in the epidural space  Complications: None  Description of procedure: The patient was brought to the operating room by the anesthesia team. General endotracheal anesthesia was induced. The patient was turned to the prone position on the Wilson frame. The patient's lumbosacral region was then prepared with Betadine scrub and Betadine solution.  Sterile drapes were applied.  I then injected the area to be incised with Marcaine with epinephrine solution. I then used the scalpel to make a linear midline incision over the L4-5 interspace. I then used electrocautery to perform a bilateral subperiosteal dissection exposing the spinous process and lamina of L4-5. We then obtained intraoperative radiograph to confirm our location. We then inserted the Verstrac retractor to provide exposure.  I began the decompression by using the high speed drill to perform laminotomies at L4-5 bilaterally. We then used the Kerrison punches to widen the laminotomy and removed the ligamentum flavum at L4-5 bilaterally. We used the Kerrison punches to remove the medial facets at L4-5 bilaterally. We performed wide foraminotomies about the bilateral L4 and L5 nerve roots completing the decompression.  We now turned our attention to the posterior lumbar interbody fusion. I used a scalpel to incise the intervertebral disc at L4-5 bilaterally. I then performed a partial intervertebral discectomy at L4-5 bilaterally using the pituitary forceps. We prepared the vertebral endplates at L4-5 bilaterally for the fusion by removing the soft tissues with the curettes. We then used the trial spacers to pick the appropriate sized interbody prosthesis. We prefilled his prosthesis with a combination of local morselized autograft bone that we obtained during the decompression as well as Zimmer DBM. We inserted the prefilled prosthesis into the interspace at L4-5 from the left, we then turned and expanded the prosthesis. There was a good snug fit of the prosthesis in the interspace. We then filled and the remainder of the intervertebral disc space with local morselized autograft bone and Zimmer DBM. This completed the posterior lumbar interbody arthrodesis.  During the decompression and insertion of the prosthesis the assistant protected the thecal sac and nerve roots with the D'Errico  retractor.  We now turned attention to the instrumentation. Under fluoroscopic guidance  we cannulated the bilateral L4 and L5 pedicles with the bone probe. We then removed the bone probe. We then tapped the pedicle with a 6 5 millimeter tap. We then removed the tap. We probed inside the tapped pedicle with a ball probe to rule out cortical breaches. We then inserted a 7.5 x 50 millimeter pedicle screw into the L4 and L5 pedicles bilaterally under fluoroscopic guidance. We then palpated along the medial aspect of the pedicles to rule out cortical breaches. There were none. The nerve roots were not injured. We then connected the unilateral pedicle screws with a lordotic rod. We compressed the construct and secured the rod in place with the caps. We then tightened the caps appropriately. This completed the instrumentation from L4-5.  We now turned our attention to the posterior lateral arthrodesis at L4-5. We used the high-speed drill to decorticate the remainder of the facets, pars, transverse process at L4-5. We then applied a combination of local morselized autograft bone and Zimmer DBM over these decorticated posterior lateral structures. This completed the posterior lateral arthrodesis.  We then obtained hemostasis using bipolar electrocautery. We irrigated the wound out with vashe solution. We inspected the thecal sac and nerve roots and noted they were well decompressed. We then removed the retractor.  We injected Exparel .  We placed a medium Hemovac drain in the epidural space and tunneled out through a separate stab wound.  We reapproximated patient's thoracolumbar fascia with interrupted #1 Vicryl suture. We reapproximated patient's subcutaneous tissue with interrupted 2-0 Vicryl suture. The reapproximated patient's skin with Steri-Strips and benzoin. The wound was then coated with bacitracin ointment. A sterile dressing was applied. The drapes were removed. The patient was subsequently returned to the  supine position where they were extubated by the anesthesia team. He was then transported to the post anesthesia care unit in stable condition. All sponge instrument and needle counts were reportedly correct at the end of this case.

## 2023-12-05 DIAGNOSIS — Z8049 Family history of malignant neoplasm of other genital organs: Secondary | ICD-10-CM | POA: Diagnosis not present

## 2023-12-05 DIAGNOSIS — J449 Chronic obstructive pulmonary disease, unspecified: Secondary | ICD-10-CM | POA: Diagnosis present

## 2023-12-05 DIAGNOSIS — Z9841 Cataract extraction status, right eye: Secondary | ICD-10-CM | POA: Diagnosis not present

## 2023-12-05 DIAGNOSIS — Z9842 Cataract extraction status, left eye: Secondary | ICD-10-CM | POA: Diagnosis not present

## 2023-12-05 DIAGNOSIS — I10 Essential (primary) hypertension: Secondary | ICD-10-CM | POA: Diagnosis present

## 2023-12-05 DIAGNOSIS — Z9049 Acquired absence of other specified parts of digestive tract: Secondary | ICD-10-CM | POA: Diagnosis not present

## 2023-12-05 DIAGNOSIS — M5116 Intervertebral disc disorders with radiculopathy, lumbar region: Secondary | ICD-10-CM | POA: Diagnosis present

## 2023-12-05 DIAGNOSIS — M48062 Spinal stenosis, lumbar region with neurogenic claudication: Secondary | ICD-10-CM | POA: Diagnosis present

## 2023-12-05 DIAGNOSIS — I2510 Atherosclerotic heart disease of native coronary artery without angina pectoris: Secondary | ICD-10-CM | POA: Diagnosis present

## 2023-12-05 DIAGNOSIS — Z833 Family history of diabetes mellitus: Secondary | ICD-10-CM | POA: Diagnosis not present

## 2023-12-05 DIAGNOSIS — R112 Nausea with vomiting, unspecified: Secondary | ICD-10-CM | POA: Diagnosis not present

## 2023-12-05 DIAGNOSIS — Z853 Personal history of malignant neoplasm of breast: Secondary | ICD-10-CM | POA: Diagnosis not present

## 2023-12-05 DIAGNOSIS — Z79899 Other long term (current) drug therapy: Secondary | ICD-10-CM | POA: Diagnosis not present

## 2023-12-05 DIAGNOSIS — M4726 Other spondylosis with radiculopathy, lumbar region: Secondary | ICD-10-CM | POA: Diagnosis present

## 2023-12-05 DIAGNOSIS — Z8249 Family history of ischemic heart disease and other diseases of the circulatory system: Secondary | ICD-10-CM | POA: Diagnosis not present

## 2023-12-05 DIAGNOSIS — Z961 Presence of intraocular lens: Secondary | ICD-10-CM | POA: Diagnosis present

## 2023-12-05 DIAGNOSIS — M4316 Spondylolisthesis, lumbar region: Secondary | ICD-10-CM | POA: Diagnosis present

## 2023-12-05 MED ORDER — HYDROCODONE-ACETAMINOPHEN 5-325 MG PO TABS
1.0000 | ORAL_TABLET | ORAL | Status: DC | PRN
  Administered 2023-12-05 (×2): 2 via ORAL
  Administered 2023-12-05: 1 via ORAL
  Administered 2023-12-05: 2 via ORAL
  Filled 2023-12-05 (×3): qty 2
  Filled 2023-12-05: qty 1

## 2023-12-05 MED ORDER — PANTOPRAZOLE SODIUM 40 MG IV SOLR
40.0000 mg | Freq: Once | INTRAVENOUS | Status: AC
  Administered 2023-12-05: 40 mg via INTRAVENOUS
  Filled 2023-12-05: qty 10

## 2023-12-05 MED FILL — Heparin Sodium (Porcine) Inj 1000 Unit/ML: INTRAMUSCULAR | Qty: 30 | Status: AC

## 2023-12-05 MED FILL — Sodium Chloride IV Soln 0.9%: INTRAVENOUS | Qty: 1000 | Status: AC

## 2023-12-05 MED FILL — Thrombin For Soln 5000 Unit: CUTANEOUS | Qty: 5000 | Status: AC

## 2023-12-05 NOTE — Evaluation (Signed)
 Physical Therapy Evaluation  Patient Details Name: Deanna Walsh MRN: [id number] DOB: April 18, 1946 Today's Date: 12/05/2023  History of Present Illness  Pt is a 78 y/o female who presents s/p L4-L5 PLIF on 12/04/2023. PMH significant for L breast CA, COPD, DOE, HTN, mitral valve prolapse, neuropathy, osteopenia, OA.  Clinical Impression  Pt admitted with above diagnosis. At the time of PT eval, pt was able to demonstrate transfers and ambulation with up to min assist and RW for support. Session limited by nausea and vomiting. Pt was educated on precautions, brace application/wearing schedule, appropriate activity progression, and car transfer. Pt currently with functional limitations due to the deficits listed below (see PT Problem List). Pt will benefit from skilled PT to increase their independence and safety with mobility to allow discharge to the venue listed below.          If plan is discharge home, recommend the following: A little help with walking and/or transfers;A little help with bathing/dressing/bathroom;Assistance with cooking/housework;Assist for transportation;Help with stairs or ramp for entrance   Can travel by private vehicle        Equipment Recommendations None recommended by PT  Recommendations for Other Services       Functional Status Assessment Patient has had a recent decline in their functional status and demonstrates the ability to make significant improvements in function in a reasonable and predictable amount of time.     Precautions / Restrictions Precautions Precautions: Fall;Back Precaution Booklet Issued: No Recall of Precautions/Restrictions: Intact Precaution/Restrictions Comments: Reviewed precautions verbally and pt was cued for precautions during functional mobility. Required Braces or Orthoses: Spinal Brace Spinal Brace: Lumbar corset;Applied in sitting position Restrictions Weight Bearing Restrictions Per Provider Order: No       Mobility  Bed Mobility Overal bed mobility: Needs Assistance Bed Mobility: Sit to Sidelying         Sit to sidelying: Modified independent (Device/Increase time), Used rails General bed mobility comments: VC's    Transfers Overall transfer level: Modified independent Equipment used: Rolling walker (2 wheels)               General transfer comment: VC's for hand placement on seated surface for safety.    Ambulation/Gait Ambulation/Gait assistance: Min assist, Contact guard assist Gait Distance (Feet): 175 Feet Assistive device: Rolling walker (2 wheels), None Gait Pattern/deviations: Step-through pattern, Decreased stride length, Trunk flexed Gait velocity: decreased Gait velocity interpretation: <1.8 ft/sec, indicate of risk for recurrent falls   General Gait Details: Initially without AD. Pt with unsteadiness and LOB requiring min assist to recover. Pt given RW for support and pt with significantly improved balance. Grossly flexed trunk throughout.  Stairs            Wheelchair Mobility     Tilt Bed    Modified Rankin (Stroke Patients Only)       Balance Overall balance assessment: Needs assistance, Mild deficits observed, not formally tested Sitting-balance support: Feet supported, No upper extremity supported Sitting balance-Leahy Scale: Fair     Standing balance support: No upper extremity supported, During functional activity Standing balance-Leahy Scale: Fair Standing balance comment: in  room                             Pertinent Vitals/Pain Pain Assessment Pain Assessment: Faces Faces Pain Scale: Hurts even more Pain Location: back Pain Descriptors / Indicators: Operative site guarding, Sore Pain Intervention(s): Limited activity within patient's tolerance, Monitored during session,  Repositioned    Home Living Family/patient expects to be discharged to:: Private residence Living Arrangements: Children Available Help at  Discharge: Family;Available 24 hours/day Type of Home: House Home Access: Stairs to enter Entrance Stairs-Rails: Doctor, general practice of Steps: 3   Home Layout: One level Home Equipment: Agricultural consultant (2 wheels);Cane - single point;Tub bench      Prior Function Prior Level of Function : Independent/Modified Independent                     Extremity/Trunk Assessment   Upper Extremity Assessment Upper Extremity Assessment: Overall WFL for tasks assessed    Lower Extremity Assessment Lower Extremity Assessment: Generalized weakness (Mild; consistent with pre-op diagnosis)    Cervical / Trunk Assessment Cervical / Trunk Assessment: Back Surgery  Communication   Communication Communication: No apparent difficulties    Cognition Arousal: Alert Behavior During Therapy: WFL for tasks assessed/performed   PT - Cognitive impairments: No apparent impairments                         Following commands: Intact       Cueing Cueing Techniques: Verbal cues, Gestural cues     General Comments      Exercises     Assessment/Plan    PT Assessment Patient needs continued PT services  PT Problem List Decreased strength;Decreased activity tolerance;Decreased balance;Decreased mobility;Decreased knowledge of use of DME;Decreased safety awareness;Decreased knowledge of precautions;Pain       PT Treatment Interventions DME instruction;Gait training;Functional mobility training;Stair training;Therapeutic activities;Therapeutic exercise;Balance training;Patient/family education    PT Goals (Current goals can be found in the Care Plan section)  Acute Rehab PT Goals Patient Stated Goal: Home today, decrease nausea PT Goal Formulation: With patient Time For Goal Achievement: 12/12/23 Potential to Achieve Goals: Good    Frequency Min 5X/week     Co-evaluation               AM-PAC PT "6 Clicks" Mobility  Outcome Measure Help needed turning  from your back to your side while in a flat bed without using bedrails?: A Little Help needed moving from lying on your back to sitting on the side of a flat bed without using bedrails?: A Little Help needed moving to and from a bed to a chair (including a wheelchair)?: A Little Help needed standing up from a chair using your arms (e.g., wheelchair or bedside chair)?: A Little Help needed to walk in hospital room?: A Little Help needed climbing 3-5 steps with a railing? : A Little 6 Click Score: 18    End of Session Equipment Utilized During Treatment: Gait belt;Back brace Activity Tolerance: Patient tolerated treatment well Patient left: in bed;with call bell/phone within reach Nurse Communication: Mobility status PT Visit Diagnosis: Unsteadiness on feet (R26.81);Pain Pain - part of body:  (back)    Time: 1017-1040 PT Time Calculation (min) (ACUTE ONLY): 23 min   Charges:   PT Evaluation $PT Eval Low Complexity: 1 Low PT Treatments $Gait Training: 8-22 mins PT General Charges $$ ACUTE PT VISIT: 1 Visit         Conni Slipper, PT, DPT Acute Rehabilitation Services Secure Chat Preferred Office: [phone]   Marylynn Pearson 12/05/2023, 11:11 AM

## 2023-12-05 NOTE — Progress Notes (Signed)
   Providing Compassionate, Quality Care - Together   Subjective: Patient reports severe nausea overnight and this morning. Vistaril injection offered by nurse, but patient did not want to get an IM injection.  Objective: Vital signs in last 24 hours: Temp:  [97.5 F (36.4 C)-98 F (36.7 C)] 98 F (36.7 C) (03/27 0338) Pulse Rate:  [79-96] 79 (03/27 0845) Resp:  [13-21] 16 (03/27 0845) BP: (101-138)/(56-83) 120/67 (03/27 0845) SpO2:  [92 %-100 %] 98 % (03/27 0845)  Intake/Output from previous day: 03/26 0701 - 03/27 0700 In: 1650 [I.V.:1400; IV Piggyback:250] Out: 2075 [Urine:1745; Drains:105; Blood:225] Intake/Output this shift: No intake/output data recorded.  Alert and oriented x 4 PERRLA CN II-XII grossly intact MAE, Strength and sensation intact Incision is covered with Honeycomb dressing and Steri Strips; Dressing with small amount of sanguinous drainage at drain insertion site. Hemovac drain in place, charged, dark bloody drainage. Overnight output 105 mL  Lab Results: No results for input(s): "WBC", "HGB", "HCT", "PLT" in the last 72 hours. BMET No results for input(s): "NA", "K", "CL", "CO2", "GLUCOSE", "BUN", "CREATININE", "CALCIUM" in the last 72 hours.  Studies/Results: DG Lumbar Spine 2-3 Views Result Date: 12/04/2023 CLINICAL DATA:  L4-5 fusion EXAM: LUMBAR SPINE - 2-3 VIEW COMPARISON:  Plain film from earlier in the same day. FLUOROSCOPY TIME:  Radiation Exposure Index (as provided by the fluoroscopic device): 11.1 mGy If the device does not provide the exposure index: Fluoroscopy Time:  13.6 seconds Number of Acquired Images:  2 FINDINGS: Interbody fusion is noted at L4-5 with pedicle screws bilaterally. Posterior fusion elements have not been placed at the time of the imaging. IMPRESSION: Interbody fusion at L4-5. Electronically Signed   By: Alcide Clever M.D.   On: 12/04/2023 21:09   DG Lumbar Spine 1 View Result Date: 12/04/2023 CLINICAL DATA:   Intraoperative localization EXAM: LUMBAR SPINE - 1 VIEW COMPARISON:  07/23/2023 FINDINGS: Lateral radiograph of the lumbar spine was obtained and reveals surgical retractors and instruments posterior to the L4 level. Disc space narrowing at L4-5 and L5-S1 is noted and stable from prior MRI. IMPRESSION: Intraoperative localization at L4. Electronically Signed   By: Alcide Clever M.D.   On: 12/04/2023 21:08   DG C-Arm 1-60 Min-No Report Result Date: 12/04/2023 Fluoroscopy was utilized by the requesting physician.  No radiographic interpretation.   DG C-Arm 1-60 Min-No Report Result Date: 12/04/2023 Fluoroscopy was utilized by the requesting physician.  No radiographic interpretation.    Assessment/Plan: Patient underwent L4-5 posterior lumbar fusion by Dr. Lovell Sheehan on 12/04/2023. Will work on nausea control today and mobilization. Remove Hemovac drain. Plan for discharge tomorrow.   LOS: 0 days    I am in communication with my attending and they agree with the plan for this patient.   Val Eagle, DNP, AGNP-C Nurse Practitioner  [facility] [address], [place], [place] [address] P: [phone]    F: [phone]  12/05/2023, 11:21 AM

## 2023-12-05 NOTE — Anesthesia Postprocedure Evaluation (Signed)
 Anesthesia Post Note  Patient: Deanna Walsh  Procedure(s) Performed: LUMBAR FOUR-FIVE POSTERIOR LUMBAR INTERBODY FUSION (Spine Lumbar)     Patient location during evaluation: PACU Anesthesia Type: General Level of consciousness: awake and alert Pain management: pain level controlled Vital Signs Assessment: post-procedure vital signs reviewed and stable Respiratory status: spontaneous breathing, nonlabored ventilation, respiratory function stable and patient connected to nasal cannula oxygen Cardiovascular status: blood pressure returned to baseline and stable Postop Assessment: no apparent nausea or vomiting Anesthetic complications: no   No notable events documented.  Last Vitals:  Vitals:   12/04/23 2325 12/05/23 0338  BP: 129/65 118/61  Pulse: 93 92  Resp: 18 18  Temp: 36.6 C 36.7 C  SpO2: 100% 99%    Last Pain:  Vitals:   12/05/23 0624  TempSrc:   PainSc: 8                  Mariann Barter

## 2023-12-06 MED ORDER — CYCLOBENZAPRINE HCL 5 MG PO TABS: 5.0000 mg | ORAL_TABLET | Freq: Three times a day (TID) | ORAL | 1 refills | Status: DC | PRN

## 2023-12-06 MED ORDER — OXYCODONE HCL 5 MG PO TABS
5.0000 mg | ORAL_TABLET | ORAL | Status: DC | PRN
  Administered 2023-12-06 (×2): 10 mg via ORAL
  Filled 2023-12-06 (×3): qty 2

## 2023-12-06 MED ORDER — DOCUSATE SODIUM 100 MG PO CAPS: 100.0000 mg | ORAL_CAPSULE | Freq: Two times a day (BID) | ORAL | 0 refills | Status: AC

## 2023-12-06 MED ORDER — OXYCODONE-ACETAMINOPHEN 5-325 MG PO TABS: 1.0000 | ORAL_TABLET | ORAL | 0 refills | Status: AC | PRN

## 2023-12-06 NOTE — Discharge Instructions (Signed)

## 2023-12-06 NOTE — Evaluation (Signed)
 Occupational Therapy Evaluation Patient Details Name: Deanna Walsh MRN: [id number] DOB: Feb 18, 1946 Today's Date: 12/06/2023   History of Present Illness   Pt is a 78 y/o female who presents s/p L4-L5 PLIF on 12/04/2023. PMH significant for L breast CA, COPD, DOE, HTN, mitral valve prolapse, neuropathy, osteopenia, OA.     Clinical Impressions Patient admitted for the diagnosis above.  PTA she lives at home alone, but has two children that will be assisting as needed at home.  Patient deficits are lower extremity weakness and balance.  Patient is needing increased time and up to Min A for sit to stand from recliner, and Min A for lower body ADL.  Patient should have the needed assist at home, but would benefit from [facility] PT to regain mobility, balance and strength.  Acute OT will defer to [facility] PT to determine if [facility] OT is needed.  OT will continue to follow in the acute setting.  Patient is planned to discharge today.       If plan is discharge home, recommend the following:   Assist for transportation;Assistance with cooking/housework;A little help with bathing/dressing/bathroom;A little help with walking and/or transfers     Functional Status Assessment   Patient has had a recent decline in their functional status and demonstrates the ability to make significant improvements in function in a reasonable and predictable amount of time.     Equipment Recommendations   None recommended by OT     Recommendations for Other Services         Precautions/Restrictions   Precautions Precautions: Fall;Back Precaution Booklet Issued: No Recall of Precautions/Restrictions: Intact Required Braces or Orthoses: Spinal Brace Spinal Brace: Lumbar corset Restrictions Weight Bearing Restrictions Per Provider Order: No     Mobility Bed Mobility Overal bed mobility: Needs Assistance Bed Mobility: Sit to Sidelying         Sit to sidelying: Supervision      Transfers Overall  transfer level: Needs assistance   Transfers: Sit to/from Stand, Bed to chair/wheelchair/BSC Sit to Stand: Min assist     Step pivot transfers: Supervision            Balance Overall balance assessment: Needs assistance Sitting-balance support: Feet supported Sitting balance-Leahy Scale: Fair     Standing balance support: Reliant on assistive device for balance Standing balance-Leahy Scale: Poor                             ADL either performed or assessed with clinical judgement   ADL       Grooming: Wash/dry hands;Wash/dry face;Supervision/safety;Standing               Lower Body Dressing: Minimal assistance;Sit to/from stand   Toilet Transfer: Minimal Holiday representative;Ambulation             General ADL Comments: Patient needs assist with sit to stand, once up at RW level closer to supervision.     Vision Patient Visual Report: No change from baseline       Perception Perception: Not tested       Praxis Praxis: Not tested       Pertinent Vitals/Pain Pain Assessment Pain Assessment: Faces Faces Pain Scale: Hurts even more Pain Location: back Pain Descriptors / Indicators: Operative site guarding, Sore Pain Intervention(s): Monitored during session     Extremity/Trunk Assessment Upper Extremity Assessment Upper Extremity Assessment: Overall WFL for tasks assessed   Lower Extremity Assessment Lower Extremity  Assessment: Defer to PT evaluation   Cervical / Trunk Assessment Cervical / Trunk Assessment: Back Surgery   Communication Communication Communication: No apparent difficulties   Cognition Arousal: Lethargic Behavior During Therapy: WFL for tasks assessed/performed Cognition: No apparent impairments                               Following commands: Intact       Cueing  General Comments   Cueing Techniques: Verbal cues;Gestural cues      Exercises     Shoulder Instructions      Home  Living Family/patient expects to be discharged to:: Private residence Living Arrangements: Children Available Help at Discharge: Family;Available 24 hours/day Type of Home: House Home Access: Stairs to enter [employer] of Steps: 3 Entrance Stairs-Rails: Right;Left Home Layout: One level     Bathroom Shower/Tub: Chief Strategy Officer: Standard Bathroom Accessibility: Yes How Accessible: Accessible via walker Home Equipment: Rolling Walker (2 wheels);Cane - single point;Tub bench          Prior Functioning/Environment Prior Level of Function : Independent/Modified Independent;Driving                    OT Problem List: Decreased strength;Impaired balance (sitting and/or standing)   OT Treatment/Interventions: Self-care/ADL training;Therapeutic activities;DME and/or AE instruction;Patient/family education;Balance training      OT Goals(Current goals can be found in the care plan section)   Acute Rehab OT Goals Patient Stated Goal: Return home OT Goal Formulation: With patient Time For Goal Achievement: 12/20/23 Potential to Achieve Goals: Good ADL Goals Pt Will Perform Grooming: with modified independence;standing Pt Will Perform Lower Body Dressing: with modified independence;sit to/from stand Pt Will Transfer to Toilet: with modified independence;ambulating;regular height toilet   OT Frequency:  Min 1X/week    Co-evaluation              AM-PAC OT "6 Clicks" Daily Activity     Outcome Measure Help from another person eating meals?: None Help from another person taking care of personal grooming?: A Little Help from another person toileting, which includes using toliet, bedpan, or urinal?: A Little Help from another person bathing (including washing, rinsing, drying)?: A Little Help from another person to put on and taking off regular upper body clothing?: A Little Help from another person to put on and taking off regular lower  body clothing?: A Little 6 Click Score: 19   End of Session Equipment Utilized During Treatment: Rolling walker (2 wheels) Nurse Communication: Mobility status  Activity Tolerance: Patient tolerated treatment well Patient left: in bed;with call bell/phone within reach  OT Visit Diagnosis: Unsteadiness on feet (R26.81)                Time: 1006-1030 OT Time Calculation (min): 24 min Charges:  OT General Charges $OT Visit: 1 Visit OT Evaluation $OT Eval Moderate Complexity: 1 Mod OT Treatments $Self Care/Home Management : 8-22 mins  12/06/2023  RP, OTR/L  Acute Rehabilitation Services  Office:  [phone]   Suzanna Obey 12/06/2023, 10:35 AM

## 2023-12-06 NOTE — Plan of Care (Signed)
 Reviewed discharge instructions with patient including site care, follow-up appointments and s/s to report to Dr. Lovell Sheehan office. Pain management education completed along with next dose due. Patient verbalized understand and denied any further questions.

## 2023-12-06 NOTE — Progress Notes (Signed)
 Physical Therapy Treatment  Patient Details Name: Deanna Walsh MRN: [id number] DOB: 07/21/1946 Today's Date: 12/06/2023   History of Present Illness Pt is a 78 y/o female who presents s/p L4-L5 PLIF on 12/04/2023. PMH significant for L breast CA, COPD, DOE, HTN, mitral valve prolapse, neuropathy, osteopenia, OA.    PT Comments  Pt progressing well with post-op mobility. She was able to demonstrate transfers and ambulation with up to min assist and RW for support. Focus of session was stair training. Reinforced education on precautions, brace application/wearing schedule, appropriate activity progression, and car transfer. Will continue to follow.      If plan is discharge home, recommend the following: A little help with walking and/or transfers;A little help with bathing/dressing/bathroom;Assistance with cooking/housework;Assist for transportation;Help with stairs or ramp for entrance   Can travel by private vehicle        Equipment Recommendations  None recommended by PT    Recommendations for Other Services OT consult     Precautions / Restrictions Precautions Precautions: Fall;Back Precaution Booklet Issued: No Recall of Precautions/Restrictions: Intact Precaution/Restrictions Comments: Reviewed precautions verbally and pt was cued for precautions during functional mobility. Required Braces or Orthoses: Spinal Brace Spinal Brace: Lumbar corset;Applied in sitting position Restrictions Weight Bearing Restrictions Per Provider Order: No     Mobility  Bed Mobility Overal bed mobility: Needs Assistance Bed Mobility: Rolling, Sidelying to Sit Rolling: Supervision Sidelying to sit: Min assist       General bed mobility comments: Assist for trunk elevation to full sitting position. HOB flat and rails lowered to simulate home environment. VC's for optimal log roll technique.    Transfers Overall transfer level: Modified independent Equipment used: Rolling walker (2  wheels)               General transfer comment: VC's for hand placement on seated surface for safety.    Ambulation/Gait Ambulation/Gait assistance: Contact guard assist Gait Distance (Feet): 50 Feet Assistive device: Rolling walker (2 wheels) Gait Pattern/deviations: Step-through pattern, Decreased stride length, Trunk flexed Gait velocity: Decreased Gait velocity interpretation: <1.31 ft/sec, indicative of household ambulator   General Gait Details: Pt reports sensitivity to light at this time and hallway ambulation limited due to this. Focus was to/from stairwell for stair training.   Stairs Stairs: Yes Stairs assistance: Min assist Stair Management: One rail Right, One rail Left, Step to pattern, Sideways, Forwards Number of Stairs: 4 (1+1+2) General stair comments: Initially practicing facing forwards with heavy mod assist required through HHA. Pt then practiced sideways with BUE's first on R rail then on L. Pt did the best with BUE on L rail and light min assist to power up to next step.   Wheelchair Mobility     Tilt Bed    Modified Rankin (Stroke Patients Only)       Balance Overall balance assessment: Needs assistance, Mild deficits observed, not formally tested Sitting-balance support: Feet supported, No upper extremity supported Sitting balance-Leahy Scale: Fair     Standing balance support: During functional activity, Bilateral upper extremity supported, Reliant on assistive device for balance Standing balance-Leahy Scale: Poor                              Communication Communication Communication: No apparent difficulties  Cognition Arousal: Alert Behavior During Therapy: WFL for tasks assessed/performed   PT - Cognitive impairments: No apparent impairments  Following commands: Intact      Cueing Cueing Techniques: Verbal cues, Gestural cues  Exercises      General Comments         Pertinent Vitals/Pain Pain Assessment Pain Assessment: Faces Faces Pain Scale: Hurts even more Pain Location: back Pain Descriptors / Indicators: Operative site guarding, Sore Pain Intervention(s): Limited activity within patient's tolerance, Monitored during session, Repositioned    Home Living                          Prior Function            PT Goals (current goals can now be found in the care plan section) Acute Rehab PT Goals Patient Stated Goal: Home today, decrease pain PT Goal Formulation: With patient Time For Goal Achievement: 12/12/23 Potential to Achieve Goals: Good Progress towards PT goals: Progressing toward goals    Frequency    Min 5X/week      PT Plan      Co-evaluation              AM-PAC PT "6 Clicks" Mobility   Outcome Measure  Help needed turning from your back to your side while in a flat bed without using bedrails?: A Little Help needed moving from lying on your back to sitting on the side of a flat bed without using bedrails?: A Little Help needed moving to and from a bed to a chair (including a wheelchair)?: A Little Help needed standing up from a chair using your arms (e.g., wheelchair or bedside chair)?: A Little Help needed to walk in hospital room?: A Little Help needed climbing 3-5 steps with a railing? : A Little 6 Click Score: 18    End of Session Equipment Utilized During Treatment: Gait belt;Back brace Activity Tolerance: Patient tolerated treatment well Patient left: in bed;with call bell/phone within reach Nurse Communication: Mobility status PT Visit Diagnosis: Unsteadiness on feet (R26.81);Pain Pain - part of body:  (back)     Time: 9562-1308 PT Time Calculation (min) (ACUTE ONLY): 27 min  Charges:    $Gait Training: 23-37 mins PT General Charges $$ ACUTE PT VISIT: 1 Visit                     Conni Slipper, PT, DPT Acute Rehabilitation Services Secure Chat Preferred Office: [phone]     Deanna Walsh 12/06/2023, 10:05 AM

## 2023-12-06 NOTE — Discharge Summary (Signed)
 Physician Discharge Summary     Providing Compassionate, Quality Care - Together   Patient ID: Deanna Walsh MRN: [id number] DOB/AGE: [birth year] 77 y.o.  Admit date: 12/04/2023 Discharge date: 12/06/2023  Admission Diagnoses: Lumbar spondylolisthesis  Discharge Diagnoses:  Principal Problem:   Spondylolisthesis of lumbar region   Discharged Condition: good  Hospital Course: Patient underwent an L4-5 posterior lumbar fusion by Dr. Lovell Sheehan on 12/04/2023. She was admitted to 3C08  following recovery from anesthesia in the PACU. Her postoperative was complicated by postoperative nausea and vomiting. This has resolved. She has worked with physical therapy who feels the patient is ready for discharge home. She is ambulating independently and without difficulty. She is tolerating a normal diet. She is not having any bowel or bladder dysfunction. Her pain is reasonably controlled with oral pain medication. She is ready for discharge home.   Consults: PT/TOC  Significant Diagnostic Studies: radiology: DG Lumbar Spine 2-3 Views Result Date: 12/04/2023 CLINICAL DATA:  L4-5 fusion EXAM: LUMBAR SPINE - 2-3 VIEW COMPARISON:  Plain film from earlier in the same day. FLUOROSCOPY TIME:  Radiation Exposure Index (as provided by the fluoroscopic device): 11.1 mGy If the device does not provide the exposure index: Fluoroscopy Time:  13.6 seconds Number of Acquired Images:  2 FINDINGS: Interbody fusion is noted at L4-5 with pedicle screws bilaterally. Posterior fusion elements have not been placed at the time of the imaging. IMPRESSION: Interbody fusion at L4-5. Electronically Signed   By: Alcide Clever M.D.   On: 12/04/2023 21:09   DG Lumbar Spine 1 View Result Date: 12/04/2023 CLINICAL DATA:  Intraoperative localization EXAM: LUMBAR SPINE - 1 VIEW COMPARISON:  07/23/2023 FINDINGS: Lateral radiograph of the lumbar spine was obtained and reveals surgical retractors and instruments posterior to the L4 level.  Disc space narrowing at L4-5 and L5-S1 is noted and stable from prior MRI. IMPRESSION: Intraoperative localization at L4. Electronically Signed   By: Alcide Clever M.D.   On: 12/04/2023 21:08   DG C-Arm 1-60 Min-No Report Result Date: 12/04/2023 Fluoroscopy was utilized by the requesting physician.  No radiographic interpretation.   DG C-Arm 1-60 Min-No Report Result Date: 12/04/2023 Fluoroscopy was utilized by the requesting physician.  No radiographic interpretation.     Treatments: surgery:  Bilateral L4-5 laminotomy/foraminotomies/medial facetectomy to decompress the bilateral L4 and L5 nerve roots(the work required to do this was in addition to the work required to do the posterior lumbar interbody fusion because of the patient's spinal stenosis, facet arthropathy. Etc. requiring a wide decompression of the nerve roots.); left L4-5 transforaminal lumbar interbody fusion with local morselized autograft bone and Zimmer DBM; insertion of interbody prosthesis at L4-5 (globus peek expandable interbody prosthesis); posterior nonsegmental instrumentation from L4 to L5 with globus titanium pedicle screws and rods; posterior lateral arthrodesis at L4-5 with local morselized autograft bone and Zimmer DBM.   Discharge Exam: Blood pressure (!) 114/50, pulse 94, temperature (!) 97.4 F (36.3 C), temperature source Oral, resp. rate 19, height 5\' 5"  (1.651 m), weight 74.8 kg, SpO2 95%.  Alert and oriented x 4 PERRLA CN II-XII grossly intact MAE, Strength and sensation intact Incision is covered with Honeycomb dressing and Steri Strips; Dressing is clean, dry, and intact  Disposition:    Allergies as of 12/06/2023       Reactions   Pravastatin Other (See Comments)   Severe Nose Bleed         Medication List     STOP taking these medications  ibuprofen 800 MG tablet Commonly known as: ADVIL       TAKE these medications    acetaminophen 500 MG tablet Commonly known as:  TYLENOL Take 500-1,000 mg by mouth every 6 (six) hours as needed (pain.).   amLODipine 5 MG tablet Commonly known as: NORVASC Take 1 tablet (5 mg total) by mouth daily.   BLACK COHOSH EXTRACT PO Take 100 mg by mouth daily as needed (Summer time only / hot flashes).   cetirizine 5 MG tablet Commonly known as: ZYRTEC Take 5 mg by mouth daily. At night   cyanocobalamin 1000 MCG tablet Commonly known as: VITAMIN B12 Take 1,000 mcg by mouth daily.   cyclobenzaprine 5 MG tablet Commonly known as: FLEXERIL Take 1 tablet (5 mg total) by mouth 3 (three) times daily as needed for muscle spasms.   docusate sodium 100 MG capsule Commonly known as: COLACE Take 1 capsule (100 mg total) by mouth 2 (two) times daily.   FML Forte 0.25 % ophthalmic suspension Generic drug: fluorometholone Place 1 drop into the left eye daily. Taking for blocked tear duct   latanoprost 0.005 % ophthalmic solution Commonly known as: XALATAN Place 1 drop into both eyes at bedtime.   MSM PO Take 1,000 mg by mouth in the morning.   oxyCODONE-acetaminophen 5-325 MG tablet Commonly known as: Percocet Take 1-2 tablets by mouth every 4 (four) hours as needed.   polyethylene glycol 17 g packet Commonly known as: MIRALAX / GLYCOLAX Take 17 g by mouth in the morning.   PreserVision AREDS 2 Caps Take 1 capsule by mouth in the morning and at bedtime.   ALGAE BASED CALCIUM PO Take 1,000 mg by mouth in the morning and at bedtime.   Systane Complete PF 0.6 % Soln Generic drug: Propylene Glycol (PF) Place 1 drop into both eyes 3 (three) times daily as needed (dry eyes).               Discharge Care Instructions  (From admission, onward)           Start     Ordered   12/06/23 0000  If the dressing is still on your incision site when you go home, remove it on the third day after your surgery date. Remove dressing if it begins to fall off, or if it is dirty or damaged before the third day.         12/06/23 1054            Follow-up Information     Tressie Stalker, MD. Go on 12/24/2023.   Specialty: Neurosurgery Why: First post op appointment with x-rays is on 12/24/2023 at 1:45 PM. Contact information: [address] [place] [address] [phone]                 Signed: Val Eagle, DNP, AGNP-C Nurse Practitioner  [facility] [address], [place], [place] [address] P: [phone]    F: [phone]  12/06/2023, 8:51 AM

## 2023-12-23 DIAGNOSIS — Z9889 Other specified postprocedural states: Secondary | ICD-10-CM | POA: Insufficient documentation

## 2024-01-08 ENCOUNTER — Ambulatory Visit: Payer: PPO | Admitting: Cardiology

## 2024-02-11 DIAGNOSIS — G609 Hereditary and idiopathic neuropathy, unspecified: Secondary | ICD-10-CM | POA: Insufficient documentation

## 2024-02-11 DIAGNOSIS — R202 Paresthesia of skin: Secondary | ICD-10-CM | POA: Insufficient documentation

## 2024-02-12 ENCOUNTER — Encounter: Payer: Self-pay | Admitting: Cardiology

## 2024-02-12 ENCOUNTER — Ambulatory Visit: Attending: Cardiology | Admitting: Cardiology

## 2024-02-12 VITALS — BP 108/66 | HR 80 | Ht 65.5 in | Wt 166.4 lb

## 2024-02-12 DIAGNOSIS — I2510 Atherosclerotic heart disease of native coronary artery without angina pectoris: Secondary | ICD-10-CM

## 2024-02-12 DIAGNOSIS — E785 Hyperlipidemia, unspecified: Secondary | ICD-10-CM

## 2024-02-12 DIAGNOSIS — I341 Nonrheumatic mitral (valve) prolapse: Secondary | ICD-10-CM

## 2024-02-12 DIAGNOSIS — I10 Essential (primary) hypertension: Secondary | ICD-10-CM | POA: Diagnosis not present

## 2024-02-12 NOTE — Patient Instructions (Addendum)
Medication Instructions:  Your physician recommends that you continue on your current medications as directed. Please refer to the Current Medication list given to you today.  *If you need a refill on your cardiac medications before your next appointment, please call your pharmacy*   Lab Work: Your physician recommends that you return for lab work in: when fasting You need to have labs done when you are fasting.  You can come Monday through Friday 8:30 am to 12:00 pm and 1:15 to 4:30. You do not need to make an appointment as the order has already been placed. The labs you are going to have done are AST, ALT Lipids.    Testing/Procedures: None Ordered   Follow-Up: At [facility], you and your health needs are our priority.  As part of our continuing mission to provide you with exceptional heart care, we have created designated Provider Care Teams.  These Care Teams include your primary Cardiologist (physician) and Advanced Practice Providers (APPs -  Physician Assistants and Nurse Practitioners) who all work together to provide you with the care you need, when you need it.  We recommend signing up for the patient portal called "MyChart".  Sign up information is provided on this After Visit Summary.  MyChart is used to connect with patients for Virtual Visits (Telemedicine).  Patients are able to view lab/test results, encounter notes, upcoming appointments, etc.  Non-urgent messages can be sent to your provider as well.   To learn more about what you can do with MyChart, go to [email or web address].    Your next appointment:   6 month(s)  The format for your next appointment:   In Person  Provider:   Gypsy Balsam, MD    Other Instructions NA

## 2024-02-12 NOTE — Progress Notes (Signed)
 Cardiology Office Note:    Date:  02/12/2024   ID:  Deanna Walsh, DOB 1946-08-02, MRN [id number]  PCP:  Abbe Hoard., MD  Cardiologist:  Ralene Burger, MD    Referring MD: Abbe Hoard., MD   Chief Complaint  Patient presents with   Follow-up    History of Present Illness:    Deanna Walsh is a 78 y.o. female past medical history significant for essential hypertension dyslipidemia she got calcium score of 8.83 in 2023 after that she got stress test done for evaluation before elective back surgery stress test showed ischemia in LAD territory cardiac catheterization after that showed only 20% LAD lesion she did went to surgery with no difficulties.  Comes today for follow-up doing very well happy with the surgery much less pain doing well no chest pain tightness squeezing pressure in chest  Past Medical History:  Diagnosis Date   Abnormal CXR 10/06/2018   Formatting of this note might be different from the original. She had abnormal CXR that showed COPD but patient does not think she does though she has had second hand smoke exposure, discussed with her recommendation for PFTs she wants to wait on this   Calcification of coronary artery 05/09/2022   Cancer (HCC)    Breast Cancer - Left   Chronic constipation 11/30/2019   Collagen vascular disease (HCC)    COPD suggested by initial evaluation (HCC) 06/22/2019   Dyslipidemia 05/08/2016   Dyspnea    related to increased back pain   Dyspnea on exertion 03/10/2020   Elevated blood-pressure reading without diagnosis of hypertension 10/06/2018   Epistaxis, recurrent 02/19/2022   Essential hypertension 01/12/2022   Headache    Hx of migraines in 20s   Heart murmur    History of breast cancer 12/31/2019   Hypertension    Malaise and fatigue 01/20/2016   Last Assessment & Plan:  Formatting of this note might be different from the original. Relevant Hx: Course: Daily Update: Today's Plan:update her labs for her and  discussed with her energy levels and her sleep which is stable overall  Electronically signed by: Despina Floro, NP 01/23/16 [phone]   Mitral valve prolapse 01/20/2016   Last Assessment & Plan:  Relevant Hx: Course: Daily Update: Today's Plan:she follows with Dr. Damario Gillie for her MVP and her US  was done about year ago and it was stable for her  Electronically signed by: Despina Floro, NP 01/23/16 0835   Mixed hyperlipidemia 01/20/2016   Last Assessment & Plan:  Relevant Hx: Course: Daily Update: Today's Plan:she is fasting for this and has been trying to watch her diet  Electronically signed by: Despina Floro, NP 01/23/16 0832   Neuropathy    Osteopenia 01/20/2016   Last Assessment & Plan:  Formatting of this note might be different from the original. Relevant Hx: Course: Daily Update: Today's Plan:she is taking Bone Strength for her bones and she still follows with her GYN for her womens health and it was this year and she was advised fosamax but refuses, Dr. Asencion Blacksmith is her provider.  Electronically signed by: Melissa Joyce Brown-Patram, NP 01/23/16 [phone]   Personal history of radiation therapy    Pneumonia    as a child   Primary osteoarthritis involving multiple joints 01/23/2016   Last Assessment & Plan:  Formatting of this note might be different from the original. Relevant Hx: Course: Daily Update: Today's Plan:she uses the ibuprofen and is careful with this and would  like to have the prescription strength version of this as is easier.  Electronically signed by: Despina Floro, NP 01/23/16 0838   Thyroid  nodule 07/01/2019    Past Surgical History:  Procedure Laterality Date   BREAST LUMPECTOMY Left 1999   CARPAL TUNNEL RELEASE Bilateral 2007   CATARACT EXTRACTION W/ INTRAOCULAR LENS IMPLANT Bilateral    CHOLECYSTECTOMY  2008   COLONOSCOPY     2020s - Polyps removed and benign   LEFT HEART CATH AND CORONARY ANGIOGRAPHY N/A 10/16/2023    Procedure: LEFT HEART CATH AND CORONARY ANGIOGRAPHY;  Surgeon: Wenona Hamilton, MD;  Location: [facility];  Service: Cardiovascular;  Laterality: N/A;   LUMBAR FUSION     L4-5   TONSILLECTOMY  1959    Current Medications: Current Meds  Medication Sig   acetaminophen  (TYLENOL ) 500 MG tablet Take 500-1,000 mg by mouth every 6 (six) hours as needed (pain.).   amLODipine  (NORVASC ) 5 MG tablet Take 1 tablet (5 mg total) by mouth daily.   BLACK COHOSH EXTRACT PO Take 100 mg by mouth daily as needed (Summer time only / hot flashes).   cyanocobalamin (VITAMIN B12) 1000 MCG tablet Take 1,000 mcg by mouth daily.   docusate sodium  (COLACE) 100 MG capsule Take 1 capsule (100 mg total) by mouth 2 (two) times daily.   fluorometholone  (FML FORTE ) 0.25 % ophthalmic suspension Place 1 drop into the left eye daily. Taking for blocked tear duct   gabapentin (NEURONTIN) 100 MG capsule Take 100 mg by mouth at bedtime.   latanoprost  (XALATAN ) 0.005 % ophthalmic solution Place 1 drop into both eyes at bedtime.   Multiple Vitamins-Minerals (ALGAE BASED CALCIUM PO) Take 1,000 mg by mouth in the morning and at bedtime.   Multiple Vitamins-Minerals (PRESERVISION AREDS 2) CAPS Take 1 capsule by mouth in the morning and at bedtime.   oxyCODONE -acetaminophen  (PERCOCET) 5-325 MG tablet Take 1-2 tablets by mouth every 4 (four) hours as needed. (Patient taking differently: Take 1-2 tablets by mouth every 4 (four) hours as needed for moderate pain (pain score 4-6) or severe pain (pain score 7-10).)   [DISCONTINUED] cetirizine (ZYRTEC) 5 MG tablet Take 5 mg by mouth daily. At night   [DISCONTINUED] cyclobenzaprine  (FLEXERIL ) 5 MG tablet Take 1 tablet (5 mg total) by mouth 3 (three) times daily as needed for muscle spasms.   [DISCONTINUED] Methylsulfonylmethane (MSM PO) Take 1,000 mg by mouth in the morning.   [DISCONTINUED] polyethylene glycol (MIRALAX  / GLYCOLAX ) 17 g packet Take 17 g by mouth in the morning.    [DISCONTINUED] Propylene Glycol, PF, (SYSTANE COMPLETE PF) 0.6 % SOLN Place 1 drop into both eyes 3 (three) times daily as needed (dry eyes).     Allergies:   Pravastatin    Social History   Socioeconomic History   Marital status: Married    Spouse name: Not on file   Number of children: Not on file   Years of education: Not on file   Highest education level: Not on file  Occupational History   Not on file  Tobacco Use   Smoking status: Never   Smokeless tobacco: Never  Vaping Use   Vaping status: Never Used  Substance and Sexual Activity   Alcohol use: Not Currently   Drug use: Never   Sexual activity: Yes  Other Topics Concern   Not on file  Social History Narrative   Not on file   Social Drivers of Health   Financial Resource Strain: Not on file  Food Insecurity: No Food Insecurity (12/06/2023)   Hunger Vital Sign    Worried About Running Out of Food in the Last Year: Never true    Ran Out of Food in the Last Year: Never true  Transportation Needs: No Transportation Needs (09/05/2023)   Received from [employer]    In the past 12 months, has lack of reliable transportation kept you from medical appointments, meetings, work or from getting things needed for daily living? : No  Physical Activity: Not on file  Stress: Not on file  Social Connections: Unknown (12/06/2023)   Social Connection and Isolation Panel [NHANES]    Frequency of Communication with Friends and Family: More than three times a week    Frequency of Social Gatherings with Friends and Family: More than three times a week    Attends Religious Services: More than 4 times per year    Active Member of [employer] or Organizations: No    Attends Engineer, structural: Not on file    Marital Status: Not on file     Family History: The patient's family history includes Cervical cancer in her mother; Diabetes in her father; Hypertension in her father. There is no history of Colon cancer,  Esophageal cancer, Rectal cancer, Stomach cancer, or Breast cancer. ROS:   Please see the history of present illness.    All 14 point review of systems negative except as described per history of present illness  EKGs/Labs/Other Studies Reviewed:         Recent Labs: 11/25/2023: BUN 13; Creatinine, Ser 0.77; Hemoglobin 15.1; Platelets 184; Potassium 4.6; Sodium 142  Recent Lipid Panel    Component Value Date/Time   CHOL 223 (H) 01/25/2023 0808   TRIG 50 01/25/2023 0808   HDL 69 01/25/2023 0808   CHOLHDL 3.2 01/25/2023 0808   LDLCALC 145 (H) 01/25/2023 0808    Physical Exam:    VS:  BP 108/66 (BP Location: Left Arm, Patient Position: Sitting)   Pulse 80   Ht 5' 5.5" (1.664 m)   Wt 166 lb 6.4 oz (75.5 kg)   SpO2 97%   BMI 27.27 kg/m     Wt Readings from Last 3 Encounters:  02/12/24 166 lb 6.4 oz (75.5 kg)  12/04/23 165 lb (74.8 kg)  11/25/23 171 lb 3.2 oz (77.7 kg)     GEN:  Well nourished, well developed in no acute distress HEENT: Normal NECK: No JVD; No carotid bruits LYMPHATICS: No lymphadenopathy CARDIAC: RRR, no murmurs, no rubs, no gallops RESPIRATORY:  Clear to auscultation without rales, wheezing or rhonchi  ABDOMEN: Soft, non-tender, non-distended MUSCULOSKELETAL:  No edema; No deformity  SKIN: Warm and dry LOWER EXTREMITIES: no swelling NEUROLOGIC:  Alert and oriented x 3 PSYCHIATRIC:  Normal affect   ASSESSMENT:    1. Calcification of coronary artery   2. Essential hypertension   3. Mitral valve prolapse    PLAN:    In order of problems listed above:  Coronary disease only minimal irregularity up to 20%.  Will continue risk modification. Essential hypertension: Blood pressure well-controlled continue present management. Mitral valve prolapse only minimal. Dyslipidemia again I reviewed issue of potentially taking cholesterol medication she is reluctant to have however she agreed to have her cholesterol rechecked last KPN show me her LDL of 145  HDL 69 which obviously is poorly controlled   Medication Adjustments/Labs and Tests Ordered: Current medicines are reviewed at length with the patient today.  Concerns regarding medicines are outlined  above.  No orders of the defined types were placed in this encounter.  Medication changes: No orders of the defined types were placed in this encounter.   Signed, Manfred Seed, MD, [facility] 02/12/2024 2:59 PM    [facility]

## 2024-06-22 ENCOUNTER — Other Ambulatory Visit: Payer: Self-pay | Admitting: Obstetrics and Gynecology

## 2024-06-22 DIAGNOSIS — Z1231 Encounter for screening mammogram for malignant neoplasm of breast: Secondary | ICD-10-CM

## 2024-08-03 ENCOUNTER — Ambulatory Visit
Admission: RE | Admit: 2024-08-03 | Discharge: 2024-08-03 | Disposition: A | Discharge status: Home / Self Care | Source: Ambulatory Visit | Attending: Obstetrics and Gynecology | Admitting: Obstetrics and Gynecology

## 2024-08-03 DIAGNOSIS — Z1231 Encounter for screening mammogram for malignant neoplasm of breast: Secondary | ICD-10-CM
